# Patient Record
Sex: Female | Born: 1980 | Race: White | Hispanic: No | Marital: Single | Smoking: Current every day smoker
Health system: Southern US, Community
[De-identification: ages and names within clinical notes are randomized; demographics above are authoritative.]

## PROBLEM LIST (undated history)

## (undated) ENCOUNTER — Inpatient Hospital Stay (HOSPITAL_COMMUNITY): Payer: Self-pay

## (undated) DIAGNOSIS — R51 Headache: Secondary | ICD-10-CM

## (undated) DIAGNOSIS — K219 Gastro-esophageal reflux disease without esophagitis: Secondary | ICD-10-CM

## (undated) DIAGNOSIS — F419 Anxiety disorder, unspecified: Secondary | ICD-10-CM

## (undated) DIAGNOSIS — K759 Inflammatory liver disease, unspecified: Secondary | ICD-10-CM

## (undated) DIAGNOSIS — B009 Herpesviral infection, unspecified: Secondary | ICD-10-CM

## (undated) HISTORY — PX: WISDOM TOOTH EXTRACTION: SHX21

## (undated) HISTORY — PX: TONSILLECTOMY AND ADENOIDECTOMY: SHX28

---

## 2013-01-31 ENCOUNTER — Inpatient Hospital Stay (HOSPITAL_COMMUNITY)
Admission: AD | Admit: 2013-01-31 | Discharge: 2013-01-31 | Disposition: A | Discharge status: Home / Self Care | Payer: Medicaid Other | Source: Ambulatory Visit | Attending: Obstetrics & Gynecology | Admitting: Obstetrics & Gynecology

## 2013-01-31 ENCOUNTER — Inpatient Hospital Stay (HOSPITAL_COMMUNITY): Payer: Medicaid Other

## 2013-01-31 ENCOUNTER — Encounter (HOSPITAL_COMMUNITY): Payer: Self-pay

## 2013-01-31 DIAGNOSIS — J029 Acute pharyngitis, unspecified: Secondary | ICD-10-CM | POA: Insufficient documentation

## 2013-01-31 DIAGNOSIS — R51 Headache: Secondary | ICD-10-CM | POA: Insufficient documentation

## 2013-01-31 DIAGNOSIS — K5900 Constipation, unspecified: Secondary | ICD-10-CM | POA: Insufficient documentation

## 2013-01-31 DIAGNOSIS — G43109 Migraine with aura, not intractable, without status migrainosus: Secondary | ICD-10-CM

## 2013-01-31 DIAGNOSIS — O99891 Other specified diseases and conditions complicating pregnancy: Secondary | ICD-10-CM | POA: Insufficient documentation

## 2013-01-31 DIAGNOSIS — R109 Unspecified abdominal pain: Secondary | ICD-10-CM | POA: Insufficient documentation

## 2013-01-31 HISTORY — DX: Anxiety disorder, unspecified: F41.9

## 2013-01-31 LAB — URINE MICROSCOPIC-ADD ON

## 2013-01-31 LAB — URINALYSIS, ROUTINE W REFLEX MICROSCOPIC
Leukocytes, UA: NEGATIVE
Nitrite: NEGATIVE
Specific Gravity, Urine: 1.025 (ref 1.005–1.030)
Urobilinogen, UA: 0.2 mg/dL (ref 0.0–1.0)
pH: 6 (ref 5.0–8.0)

## 2013-01-31 LAB — POCT PREGNANCY, URINE: Preg Test, Ur: POSITIVE — AB

## 2013-01-31 MED ORDER — IBUPROFEN 600 MG PO TABS: 600.0000 mg | ORAL_TABLET | Freq: Four times a day (QID) | ORAL | Status: DC | PRN

## 2013-01-31 MED ORDER — BUTALBITAL-APAP-CAFFEINE 50-325-40 MG PO TABS: 1.0000 | ORAL_TABLET | Freq: Four times a day (QID) | ORAL | Status: DC | PRN

## 2013-01-31 MED ORDER — BUTALBITAL-APAP-CAFFEINE 50-325-40 MG PO TABS
2.0000 | ORAL_TABLET | Freq: Once | ORAL | Status: AC
  Administered 2013-01-31: 2 via ORAL
  Filled 2013-01-31: qty 2

## 2013-01-31 MED ORDER — DIPHENHYDRAMINE HCL 50 MG/ML IJ SOLN
25.0000 mg | Freq: Once | INTRAMUSCULAR | Status: AC
  Administered 2013-01-31: 25 mg via INTRAVENOUS
  Filled 2013-01-31: qty 1

## 2013-01-31 MED ORDER — LACTATED RINGERS IV BOLUS (SEPSIS)
1000.0000 mL | Freq: Once | INTRAVENOUS | Status: AC
  Administered 2013-01-31: 1000 mL via INTRAVENOUS

## 2013-01-31 MED ORDER — DEXAMETHASONE SODIUM PHOSPHATE 10 MG/ML IJ SOLN
10.0000 mg | Freq: Once | INTRAMUSCULAR | Status: AC
  Administered 2013-01-31: 10 mg via INTRAVENOUS
  Filled 2013-01-31: qty 1

## 2013-01-31 MED ORDER — METOCLOPRAMIDE HCL 5 MG/ML IJ SOLN
10.0000 mg | Freq: Once | INTRAMUSCULAR | Status: AC
  Administered 2013-01-31: 10 mg via INTRAVENOUS
  Filled 2013-01-31: qty 2

## 2013-01-31 NOTE — Progress Notes (Signed)
Patient not in lobby

## 2013-01-31 NOTE — MAU Note (Signed)
Pt complains of a sore throat that began 01/27/13. Pt complains of a headache that began 6/714 but now describes it as a 10/10 migraine. She states she is seeing spots and experiencing lightheadedness and dizziness when standing up. Pt also states experiencing constipation but says she had a bowel movement 01/30/13. Pt states she is experiencing intermittent abdominal cramping today that radiates to her back rating it a 7/10. Pt states she currently smokes about 6 cigarettes a day and wants to quit smoking. Pt denies vaginal bleeding/vaginal discharge.

## 2013-01-31 NOTE — MAU Note (Signed)
Patient states she has had a sore throat with slight cough for 3-4 days. Feels like sinus problems. Has been constipated with a BM yesterday but was hard. States she is having mid to upper abdominal pain. Denies vomiting, bleeding or discharge.

## 2013-01-31 NOTE — MAU Provider Note (Signed)
History     CSN: [id number]  Arrival date and time: 01/31/13 1631    First Provider Initiated Contact with Patient 01/31/13 1933      Chief Complaint  Patient presents with  . Sore Throat  . Headache  . Constipation  . Abdominal Cramping   HPI 32 y.o. [id number] at [redacted]w[redacted]d with headache since yesterday, getting worse, photophobia, seeing spots. No nausea. Sore throat x 1 week.   Past Medical History  Diagnosis Date  . Anxiety     Past Surgical History  Procedure Laterality Date  . Cesarean section      Family History  Problem Relation Age of Onset  . Epilepsy Mother   . Cancer Maternal Grandmother     History  Substance Use Topics  . Smoking status: Current Every Day Smoker -- 0.25 packs/day    Types: Cigarettes  . Smokeless tobacco: Not on file  . Alcohol Use: No    Allergies: Not on File  Prescriptions prior to admission  Medication Sig Dispense Refill  . OVER THE COUNTER MEDICATION Take 2 capsules by mouth once.      . Prenatal Vit-Fe Fumarate-FA (PRENATAL MULTIVITAMIN) TABS Take 1 tablet by mouth daily at 12 noon.        Review of Systems  Constitutional: Negative.  Negative for fever.  HENT: Positive for sore throat.   Eyes: Positive for photophobia.       + scotoma   Respiratory: Negative.   Cardiovascular: Negative.   Gastrointestinal: Positive for constipation. Negative for nausea, vomiting, abdominal pain and diarrhea.  Genitourinary: Negative for dysuria, urgency, frequency, hematuria and flank pain.       Negative for vaginal bleeding, vaginal discharge, dyspareunia  Musculoskeletal: Negative.   Neurological: Positive for headaches.  Psychiatric/Behavioral: Negative.    Physical Exam   Blood pressure 106/69, pulse 105, temperature 98.1 F (36.7 C), temperature source Oral, height 5\' 5"  (1.651 m), weight 216 lb (97.977 kg), last menstrual period 10/25/2012, SpO2 99.00%.  Physical Exam  Nursing note and vitals reviewed. Constitutional: She  is oriented to person, place, and time. She appears well-developed and well-nourished. No distress.  Cardiovascular: Normal rate.   Respiratory: Effort normal.  GI: Soft. There is no tenderness.  Musculoskeletal: Normal range of motion.  Neurological: She is alert and oriented to person, place, and time.  Skin: Skin is warm and dry.  Psychiatric: She has a normal mood and affect.    MAU Course  Procedures Results for orders placed during the hospital encounter of 01/31/13 (from the past 24 hour(s))  URINALYSIS, ROUTINE W REFLEX MICROSCOPIC     Status: Abnormal   Collection Time    01/31/13  5:20 PM      Result Value Range   Color, Urine YELLOW  YELLOW   APPearance CLEAR  CLEAR   Specific Gravity, Urine 1.025  1.005 - 1.030   pH 6.0  5.0 - 8.0   Glucose, UA NEGATIVE  NEGATIVE mg/dL   Hgb urine dipstick MODERATE (*) NEGATIVE   Bilirubin Urine NEGATIVE  NEGATIVE   Ketones, ur NEGATIVE  NEGATIVE mg/dL   Protein, ur NEGATIVE  NEGATIVE mg/dL   Urobilinogen, UA 0.2  0.0 - 1.0 mg/dL   Nitrite NEGATIVE  NEGATIVE   Leukocytes, UA NEGATIVE  NEGATIVE  URINE MICROSCOPIC-ADD ON     Status: Abnormal   Collection Time    01/31/13  5:20 PM      Result Value Range   Squamous Epithelial / LPF RARE  RARE   WBC, UA 0-2  <3 WBC/hpf   RBC / HPF 3-6  <3 RBC/hpf   Bacteria, UA FEW (*) RARE   Urine-Other MUCOUS PRESENT    POCT PREGNANCY, URINE     Status: Abnormal   Collection Time    01/31/13  5:27 PM      Result Value Range   Preg Test, Ur POSITIVE (*) NEGATIVE    Assessment and Plan  LR bolus, Decadron 10 mg, Reglan 10 mg, Benadryl 25 mg IV for headache U/S ordered - unable to doppler FHR  FRAZIER,NATALIE 01/31/2013, 7:31 PM   Care of pt turned over to [place], CNM at 2000. [place] ordered.  US Ob Comp Less 14 Wks  01/31/2013   *RADIOLOGY REPORT*  Clinical Data: Dating, unable to Doppler fetal heart tones  OBSTETRIC <14 WK ULTRASOUND  Technique:  Transabdominal ultrasound was performed  for evaluation of the gestation as well as the maternal uterus and adnexal regions.  Comparison:  None.  Intrauterine gestational sac: Visualized/normal in shape. Yolk sac: Not visualized Embryo: Present Cardiac Activity: Present Heart Rate: 176 bpm  CRL:  53 mm  12 w  0 d        [place] EDC: 08/15/2013  Maternal uterus/Adnexae: No subchronic hemorrhage.  Right ovary is within normal limits, measuring 1.3 x 2.4 x 1.7 cm.  Left ovary is not discretely visualized.  No free fluid.  IMPRESSION: Single live intrauterine gestation with estimated gestational age [redacted] weeks 0 days by crown-rump length.   Original Report Authenticated By: Charline Bills, M.D.   HA slightly better w/ Migraine cocktail. Requesting other meds. Fioricet given. Pharynx w/out erythema or exudate. Strep swab collected.   HA resolved w/ Fioricet.  Assessment: 1. Migraine with aura   2. Other current maternal conditions classifiable elsewhere, antepartum   3. Unable to hear fetal heart tones as reason for ultrasound scan   4. Acute viral pharyngitis    Plan: D/C home in stable condition. Strep A pending. Reviewed HA red flags.  Follow-up Information   Please follow up. (Start prenatal care)       Follow up with THE [facility] [place] [facility]. (As needed if symptoms worsen)    Contact information:   [address] [id number] [place] [address] [phone]       Medication List    TAKE these medications       butalbital-acetaminophen-caffeine 50-325-40 MG per tablet  Commonly known as:  FIORICET  Take 1-2 tablets by mouth every 6 (six) hours as needed for headache.     ibuprofen 600 MG tablet  Commonly known as:  ADVIL,MOTRIN  Take 1 tablet (600 mg total) by mouth every 6 (six) hours as needed for pain. Do not take after [redacted] weeks gestation. Do not take for more than 48 hours continuously.     OVER THE COUNTER MEDICATION  Take 2 capsules by mouth once.     prenatal multivitamin  Tabs  Take 1 tablet by mouth daily at 12 noon.       [place], CNM 01/31/2013 11:51 PM

## 2013-02-03 LAB — CULTURE, GROUP A STREP

## 2013-05-24 ENCOUNTER — Ambulatory Visit (HOSPITAL_COMMUNITY): Payer: Medicaid Other

## 2013-05-25 ENCOUNTER — Encounter (HOSPITAL_COMMUNITY): Payer: Self-pay | Admitting: Obstetrics and Gynecology

## 2013-05-31 ENCOUNTER — Ambulatory Visit (HOSPITAL_COMMUNITY): Payer: Medicaid Other | Attending: Obstetrics and Gynecology

## 2013-06-14 ENCOUNTER — Ambulatory Visit (HOSPITAL_COMMUNITY): Payer: Medicaid Other | Attending: Obstetrics & Gynecology

## 2013-07-08 ENCOUNTER — Ambulatory Visit (HOSPITAL_COMMUNITY): Admission: RE | Admit: 2013-07-08 | Payer: Medicaid Other | Source: Ambulatory Visit

## 2013-07-11 ENCOUNTER — Other Ambulatory Visit: Payer: Self-pay | Admitting: Obstetrics and Gynecology

## 2013-07-20 ENCOUNTER — Ambulatory Visit (HOSPITAL_COMMUNITY)
Admission: RE | Admit: 2013-07-20 | Discharge: 2013-07-20 | Disposition: A | Discharge status: Home / Self Care | Payer: Medicaid Other | Source: Ambulatory Visit | Attending: Obstetrics and Gynecology | Admitting: Obstetrics and Gynecology

## 2013-07-20 ENCOUNTER — Encounter (HOSPITAL_COMMUNITY): Payer: Self-pay

## 2013-07-29 ENCOUNTER — Encounter (HOSPITAL_COMMUNITY): Payer: Self-pay | Admitting: Pharmacist

## 2013-08-05 NOTE — Consult Note (Signed)
MFM Note  Kimberly Fritz is a 32 year old G4P3 Caucasian female at ~ 3 weeks who presents for consultation because she is hepatitis C positive. She was diagnosed in 2012 and it most likely was transmitted from her partner who is also hepatitis C positive. She also has a history of IV drug abuse but has been drug free for 13 months.  In January of 2013, she was seen by gastroenterology at [facility] during her most recent pregnancy. They obtained several labs: HBV surface antibody was positive; HCV genotyping - 1a; and HCV RNA load which was 41,000. Their plans were to perform a liver ultrasound and start treatment after delivery if she was not taking illicit drugs. However, Kimberly Fritz lost her medical insurance and therefore did not follow-up with them.    Kimberly Fritz and I had a discussion about vertical transmission of hepatitis C to her fetus. Since she is HIV negative, the risk of transmission is ~ 5 %. Also, there are mixed data about whether pregnancy has a deleterious on her hepatitis C course.  Breast feeding is OK as long as the nipples are not cracked or bloody. (However, I think her plans at this time are to adopt out using the [employer]).  Her OB history is significant for two vaginal deliveries at 36 and 35 weeks. Then in 2013 she had an urgent C/S at 35 weeks for non reassuring heart tracings. She desires a repeat C/S with a tubal ligation.   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  (Face-to-face consultation with patient: 30 min)

## 2013-08-08 ENCOUNTER — Encounter (HOSPITAL_COMMUNITY)
Admission: RE | Admit: 2013-08-08 | Discharge: 2013-08-08 | Disposition: A | Discharge status: Home / Self Care | Payer: Medicaid Other | Source: Ambulatory Visit | Attending: Obstetrics and Gynecology | Admitting: Obstetrics and Gynecology

## 2013-08-08 ENCOUNTER — Encounter (HOSPITAL_COMMUNITY): Payer: Self-pay

## 2013-08-08 HISTORY — DX: Gastro-esophageal reflux disease without esophagitis: K21.9

## 2013-08-08 HISTORY — DX: Herpesviral infection, unspecified: B00.9

## 2013-08-08 HISTORY — DX: Headache: R51

## 2013-08-08 HISTORY — DX: Inflammatory liver disease, unspecified: K75.9

## 2013-08-08 LAB — CBC
Hemoglobin: 11.8 g/dL — ABNORMAL LOW (ref 12.0–15.0)
MCH: 26.7 pg (ref 26.0–34.0)
MCHC: 33.5 g/dL (ref 30.0–36.0)
Platelets: 246 10*3/uL (ref 150–400)
RBC: 4.42 MIL/uL (ref 3.87–5.11)

## 2013-08-08 LAB — RPR: RPR Ser Ql: NONREACTIVE

## 2013-08-08 NOTE — Patient Instructions (Addendum)
   Your procedure is scheduled on:   Tuesday, Dec 16  Enter through the [employer] [facility] at: 8 am Pick up the phone at the desk and dial [phone] and inform us of your arrival.  Please call this number if you have any problems the morning of surgery: [phone]  Remember: Do not eat or drink after midnight: Monday Take these medicines the morning of surgery with a SIP OF WATER:  Zantac, valtrex  Do not wear jewelry, make-up, or FINGER nail polish No metal in your hair or on your body. Do not wear lotions, powders, perfumes. You may wear deodorant.  Please use your CHG wash as directed prior to surgery.  Do not shave anywhere for at least 12 hours prior to first CHG shower.  Do not bring valuables to the hospital. Contacts, dentures or bridgework may not be worn into surgery.  Leave suitcase in the car. After Surgery it may be brought to your room. For patients being admitted to the hospital, checkout time is 11:00am the day of discharge.  Home with mother Darel Hong cell [phone].

## 2013-08-08 NOTE — H&P (Signed)
Admission History and Physical Exam for an Obstetrics Patient  Kimberly Fritz is a 32 y.o. female, [phone], at 84w gestation, who presents for a repeat cesarean section and tubal sterilization procedure. She has been followed at the [facility].  Her pregnancy has been complicated by :  Chronic hepatitis C Herpes simplex virus. Previous cesarean section. Desires sterilization. Past history of substance abuse. Late prenatal care. Rubella nonimmune. History of preterm delivery. Anemia. Obesity. Migraine headaches  See history below.  OB History   Grav Para Term Preterm Abortions TAB SAB Ect Mult Living   4 3 1 2      3       Past Medical History  Diagnosis Date  . SVD (spontaneous vaginal delivery) 2001,2009    x 2  . Headache(784.0)   . GERD (gastroesophageal reflux disease)   . Herpes   . Anxiety     history - no meds x 1 yr  . Hepatitis     Hep C - dx 2 yrs ago - no meds - levels checked monthly    No prescriptions prior to admission    Past Surgical History  Procedure Laterality Date  . Cesarean section  2012    x 1  . Tonsillectomy and adenoidectomy    . Wisdom tooth extraction      No Known Allergies  Family History: family history includes Cancer in her maternal grandmother; Epilepsy in her mother.  Social History:  reports that she has been smoking Cigarettes.  She has a 3.75 pack-year smoking history. She has never used smokeless tobacco. She reports that she does not drink alcohol or use illicit drugs.  Review of systems: Normal pregnancy complaints.  Admission Physical Exam:    There is no weight on file to calculate BMI.  Last menstrual period 11/09/2012.  HEENT:                 Within normal limits Chest:                   Clear Heart:                    Regular rate and rhythm Abdomen:             Gravid and nontender Extremities:          Grossly  normal Neurologic exam: Grossly normal Pelvic exam:         Cervix: closing the mass checked  Prenatal labs: ABO, Rh:             --/--/O POS (12/15 1211) HBsAg:                   nonreactive HIV:                         nonreactive GBS:                      negative Antibody:              NEG (12/15 1211) Rubella:                 nonimmune RPR:                    NON REACTIVE (12/15 1210)   Results for orders placed during the hospital encounter of 08/08/13 (from the past 24 hour(s))  CBC     Status: Abnormal   Collection Time    08/08/13 12:10 PM      Result Value Range   WBC 12.1 (*) 4.0 - 10.5 K/uL   RBC 4.42  3.87 - 5.11 MIL/uL   Hemoglobin 11.8 (*) 12.0 - 15.0 g/dL   HCT 28.4 (*) 13.2 - 44.0 %   MCV 79.6  78.0 - 100.0 fL   MCH 26.7  26.0 - 34.0 pg   MCHC 33.5  30.0 - 36.0 g/dL   RDW 10.2  72.5 - 36.6 %   Platelets 246  150 - 400 K/uL  RPR     Status: None   Collection Time    08/08/13 12:10 PM      Result Value Range   RPR NON REACTIVE  NON REACTIVE  ABO/RH     Status: None   Collection Time    08/08/13 12:10 PM      Result Value Range   ABO/RH(D) O POS    TYPE AND SCREEN     Status: None   Collection Time    08/08/13 12:11 PM      Result Value Range   ABO/RH(D) O POS     Antibody Screen NEG     Sample Expiration 08/11/2013          Prenatal Transfer Tool  Maternal Diabetes: No Genetic Screening: Normal Maternal Ultrasounds/Referrals: Normal Fetal Ultrasounds or other Referrals:  None Maternal Substance Abuse:  Not currently.  Negative drug screen on August 05, 2013 Significant Maternal Medications:  Valtrex, Urised for headaches Significant Maternal Lab Results:  Beta strep negative, quad screen negative, three-hour GTT normal Other Comments:  the patient has received her flu shot and her TDAP Shot  Assessment:  39 w gestation  Chronic hepatitis C Herpes simplex virus. Previous cesarean section. Desires sterilization. Past history of  substance abuse. Late prenatal care. Rubella nonimmune. History of preterm delivery. Anemia. Obesity. Migraine headaches  Plan:  Repeat cesarean section and tubal sterilization procedure Rubella vaccination postpartum   Janine Limbo 08/08/2013, 8:36 PM

## 2013-08-09 ENCOUNTER — Inpatient Hospital Stay (HOSPITAL_COMMUNITY)
Admission: RE | Admit: 2013-08-09 | Discharge: 2013-08-12 | DRG: 765 | Disposition: A | Discharge status: Home / Self Care | Payer: Medicaid Other | Source: Ambulatory Visit | Attending: Obstetrics and Gynecology | Admitting: Obstetrics and Gynecology

## 2013-08-09 ENCOUNTER — Encounter (HOSPITAL_COMMUNITY)
Admission: RE | Disposition: A | Discharge status: Home / Self Care | Payer: Self-pay | Source: Ambulatory Visit | Attending: Obstetrics and Gynecology

## 2013-08-09 ENCOUNTER — Inpatient Hospital Stay (HOSPITAL_COMMUNITY): Payer: Medicaid Other | Admitting: Anesthesiology

## 2013-08-09 ENCOUNTER — Encounter (HOSPITAL_COMMUNITY): Payer: Self-pay | Admitting: *Deleted

## 2013-08-09 ENCOUNTER — Encounter (HOSPITAL_COMMUNITY): Payer: Medicaid Other | Admitting: Anesthesiology

## 2013-08-09 DIAGNOSIS — O0930 Supervision of pregnancy with insufficient antenatal care, unspecified trimester: Secondary | ICD-10-CM

## 2013-08-09 DIAGNOSIS — O98519 Other viral diseases complicating pregnancy, unspecified trimester: Secondary | ICD-10-CM | POA: Diagnosis present

## 2013-08-09 DIAGNOSIS — Z302 Encounter for sterilization: Secondary | ICD-10-CM

## 2013-08-09 DIAGNOSIS — E669 Obesity, unspecified: Secondary | ICD-10-CM | POA: Diagnosis present

## 2013-08-09 DIAGNOSIS — A6000 Herpesviral infection of urogenital system, unspecified: Secondary | ICD-10-CM | POA: Diagnosis present

## 2013-08-09 DIAGNOSIS — O26619 Liver and biliary tract disorders in pregnancy, unspecified trimester: Secondary | ICD-10-CM | POA: Diagnosis present

## 2013-08-09 DIAGNOSIS — O34219 Maternal care for unspecified type scar from previous cesarean delivery: Principal | ICD-10-CM | POA: Diagnosis present

## 2013-08-09 DIAGNOSIS — O9902 Anemia complicating childbirth: Secondary | ICD-10-CM | POA: Diagnosis present

## 2013-08-09 DIAGNOSIS — B182 Chronic viral hepatitis C: Secondary | ICD-10-CM | POA: Diagnosis present

## 2013-08-09 DIAGNOSIS — O99334 Smoking (tobacco) complicating childbirth: Secondary | ICD-10-CM | POA: Diagnosis present

## 2013-08-09 SURGERY — Surgical Case
Anesthesia: Spinal | Site: Abdomen | Laterality: Bilateral

## 2013-08-09 MED ORDER — FENTANYL CITRATE 0.05 MG/ML IJ SOLN
INTRAMUSCULAR | Status: AC
  Filled 2013-08-09: qty 2

## 2013-08-09 MED ORDER — OXYTOCIN 10 UNIT/ML IJ SOLN
INTRAMUSCULAR | Status: AC
  Filled 2013-08-09: qty 4

## 2013-08-09 MED ORDER — ONDANSETRON HCL 4 MG/2ML IJ SOLN: 4.0000 mg | INTRAMUSCULAR | Status: DC | PRN

## 2013-08-09 MED ORDER — SCOPOLAMINE 1 MG/3DAYS TD PT72
MEDICATED_PATCH | TRANSDERMAL | Status: AC
  Administered 2013-08-09: 1.5 mg via TRANSDERMAL
  Filled 2013-08-09: qty 1

## 2013-08-09 MED ORDER — MEPERIDINE HCL 25 MG/ML IJ SOLN: 6.2500 mg | INTRAMUSCULAR | Status: DC | PRN

## 2013-08-09 MED ORDER — ONDANSETRON HCL 4 MG/2ML IJ SOLN
INTRAMUSCULAR | Status: AC
  Filled 2013-08-09: qty 2

## 2013-08-09 MED ORDER — HYDROMORPHONE HCL PF 1 MG/ML IJ SOLN
0.2500 mg | INTRAMUSCULAR | Status: DC | PRN
  Administered 2013-08-09: 0.5 mg via INTRAVENOUS

## 2013-08-09 MED ORDER — HYDROMORPHONE HCL PF 1 MG/ML IJ SOLN
INTRAMUSCULAR | Status: AC
  Administered 2013-08-09: 0.5 mg via INTRAVENOUS
  Filled 2013-08-09: qty 1

## 2013-08-09 MED ORDER — DIPHENHYDRAMINE HCL 25 MG PO CAPS: 25.0000 mg | ORAL_CAPSULE | Freq: Four times a day (QID) | ORAL | Status: DC | PRN

## 2013-08-09 MED ORDER — CEFAZOLIN SODIUM-DEXTROSE 2-3 GM-% IV SOLR
2.0000 g | INTRAVENOUS | Status: AC
  Administered 2013-08-09: 2 mg via INTRAVENOUS

## 2013-08-09 MED ORDER — NALBUPHINE SYRINGE 5 MG/0.5 ML
INJECTION | INTRAMUSCULAR | Status: AC
  Administered 2013-08-09: 10 mg via SUBCUTANEOUS
  Filled 2013-08-09: qty 1

## 2013-08-09 MED ORDER — IBUPROFEN 600 MG PO TABS
600.0000 mg | ORAL_TABLET | Freq: Four times a day (QID) | ORAL | Status: DC
  Administered 2013-08-09 – 2013-08-12 (×10): 600 mg via ORAL
  Filled 2013-08-09 (×10): qty 1

## 2013-08-09 MED ORDER — DEXTROSE 5 % IV SOLN
1.0000 ug/kg/h | INTRAVENOUS | Status: DC | PRN
  Filled 2013-08-09: qty 2

## 2013-08-09 MED ORDER — SCOPOLAMINE 1 MG/3DAYS TD PT72
1.0000 | MEDICATED_PATCH | Freq: Once | TRANSDERMAL | Status: DC
  Filled 2013-08-09: qty 1

## 2013-08-09 MED ORDER — WITCH HAZEL-GLYCERIN EX PADS: 1.0000 "application " | MEDICATED_PAD | CUTANEOUS | Status: DC | PRN

## 2013-08-09 MED ORDER — SIMETHICONE 80 MG PO CHEW
80.0000 mg | CHEWABLE_TABLET | Freq: Three times a day (TID) | ORAL | Status: DC
  Administered 2013-08-10 – 2013-08-12 (×6): 80 mg via ORAL
  Filled 2013-08-09 (×7): qty 1

## 2013-08-09 MED ORDER — SENNOSIDES-DOCUSATE SODIUM 8.6-50 MG PO TABS
2.0000 | ORAL_TABLET | ORAL | Status: DC
  Administered 2013-08-09 – 2013-08-11 (×3): 2 via ORAL
  Filled 2013-08-09 (×3): qty 2

## 2013-08-09 MED ORDER — ONDANSETRON HCL 4 MG PO TABS: 4.0000 mg | ORAL_TABLET | ORAL | Status: DC | PRN

## 2013-08-09 MED ORDER — KETOROLAC TROMETHAMINE 30 MG/ML IJ SOLN
30.0000 mg | Freq: Once | INTRAMUSCULAR | Status: AC
  Administered 2013-08-09: 30 mg via INTRAVENOUS
  Filled 2013-08-09: qty 1

## 2013-08-09 MED ORDER — FENTANYL CITRATE 0.05 MG/ML IJ SOLN
INTRAMUSCULAR | Status: DC | PRN
  Administered 2013-08-09: 25 ug via INTRAVENOUS

## 2013-08-09 MED ORDER — LANOLIN HYDROUS EX OINT: 1.0000 "application " | TOPICAL_OINTMENT | CUTANEOUS | Status: DC | PRN

## 2013-08-09 MED ORDER — PHENYLEPHRINE HCL 10 MG/ML IJ SOLN
INTRAMUSCULAR | Status: AC
  Filled 2013-08-09: qty 1

## 2013-08-09 MED ORDER — DIPHENHYDRAMINE HCL 25 MG PO CAPS: 25.0000 mg | ORAL_CAPSULE | ORAL | Status: DC | PRN

## 2013-08-09 MED ORDER — ZOLPIDEM TARTRATE 5 MG PO TABS: 5.0000 mg | ORAL_TABLET | Freq: Every evening | ORAL | Status: DC | PRN

## 2013-08-09 MED ORDER — NALBUPHINE HCL 10 MG/ML IJ SOLN
5.0000 mg | INTRAMUSCULAR | Status: DC | PRN
  Administered 2013-08-09: 10 mg via SUBCUTANEOUS
  Filled 2013-08-09: qty 1

## 2013-08-09 MED ORDER — OXYTOCIN 10 UNIT/ML IJ SOLN
40.0000 [IU] | INTRAVENOUS | Status: DC | PRN
  Administered 2013-08-09: 40 [IU] via INTRAVENOUS

## 2013-08-09 MED ORDER — DIPHENHYDRAMINE HCL 50 MG/ML IJ SOLN: 12.5000 mg | INTRAMUSCULAR | Status: DC | PRN

## 2013-08-09 MED ORDER — SIMETHICONE 80 MG PO CHEW: 80.0000 mg | CHEWABLE_TABLET | ORAL | Status: DC | PRN

## 2013-08-09 MED ORDER — SCOPOLAMINE 1 MG/3DAYS TD PT72
1.0000 | MEDICATED_PATCH | Freq: Once | TRANSDERMAL | Status: AC
  Administered 2013-08-09: 1.5 mg via TRANSDERMAL

## 2013-08-09 MED ORDER — DIPHENHYDRAMINE HCL 50 MG/ML IJ SOLN: 25.0000 mg | INTRAMUSCULAR | Status: DC | PRN

## 2013-08-09 MED ORDER — MEASLES, MUMPS & RUBELLA VAC ~~LOC~~ INJ
0.5000 mL | INJECTION | Freq: Once | SUBCUTANEOUS | Status: AC
  Administered 2013-08-10: 0.5 mL via SUBCUTANEOUS
  Filled 2013-08-09 (×2): qty 0.5

## 2013-08-09 MED ORDER — PRENATAL MULTIVITAMIN CH
1.0000 | ORAL_TABLET | Freq: Every day | ORAL | Status: DC
  Administered 2013-08-10 – 2013-08-11 (×2): 1 via ORAL
  Filled 2013-08-09 (×2): qty 1

## 2013-08-09 MED ORDER — NALBUPHINE HCL 10 MG/ML IJ SOLN
5.0000 mg | INTRAMUSCULAR | Status: DC | PRN
  Filled 2013-08-09: qty 1

## 2013-08-09 MED ORDER — OXYCODONE-ACETAMINOPHEN 5-325 MG PO TABS
1.0000 | ORAL_TABLET | ORAL | Status: DC | PRN
  Administered 2013-08-09 – 2013-08-10 (×2): 1 via ORAL
  Administered 2013-08-10 (×2): 2 via ORAL
  Administered 2013-08-10: 1 via ORAL
  Administered 2013-08-11 – 2013-08-12 (×4): 2 via ORAL
  Filled 2013-08-09: qty 2
  Filled 2013-08-09: qty 1
  Filled 2013-08-09 (×3): qty 2
  Filled 2013-08-09: qty 1
  Filled 2013-08-09 (×2): qty 2
  Filled 2013-08-09: qty 1
  Filled 2013-08-09: qty 2

## 2013-08-09 MED ORDER — ONDANSETRON HCL 4 MG/2ML IJ SOLN: 4.0000 mg | Freq: Three times a day (TID) | INTRAMUSCULAR | Status: DC | PRN

## 2013-08-09 MED ORDER — TETANUS-DIPHTH-ACELL PERTUSSIS 5-2.5-18.5 LF-MCG/0.5 IM SUSP: 0.5000 mL | Freq: Once | INTRAMUSCULAR | Status: DC

## 2013-08-09 MED ORDER — MORPHINE SULFATE 0.5 MG/ML IJ SOLN
INTRAMUSCULAR | Status: AC
  Filled 2013-08-09: qty 10

## 2013-08-09 MED ORDER — MENTHOL 3 MG MT LOZG: 1.0000 | LOZENGE | OROMUCOSAL | Status: DC | PRN

## 2013-08-09 MED ORDER — MEDROXYPROGESTERONE ACETATE 150 MG/ML IM SUSP: 150.0000 mg | INTRAMUSCULAR | Status: DC | PRN

## 2013-08-09 MED ORDER — NALOXONE HCL 0.4 MG/ML IJ SOLN: 0.4000 mg | INTRAMUSCULAR | Status: DC | PRN

## 2013-08-09 MED ORDER — BUPIVACAINE-EPINEPHRINE (PF) 0.5% -1:200000 IJ SOLN
INTRAMUSCULAR | Status: AC
  Filled 2013-08-09: qty 10

## 2013-08-09 MED ORDER — PHENYLEPHRINE HCL 10 MG/ML IJ SOLN
20.0000 mg | INTRAVENOUS | Status: DC | PRN
  Administered 2013-08-09: 60 ug/min via INTRAVENOUS

## 2013-08-09 MED ORDER — LACTATED RINGERS IV SOLN
Freq: Once | INTRAVENOUS | Status: AC
  Administered 2013-08-09: 08:00:00 via INTRAVENOUS

## 2013-08-09 MED ORDER — ONDANSETRON HCL 4 MG/2ML IJ SOLN
INTRAMUSCULAR | Status: DC | PRN
  Administered 2013-08-09: 4 mg via INTRAVENOUS

## 2013-08-09 MED ORDER — DIBUCAINE 1 % RE OINT: 1.0000 "application " | TOPICAL_OINTMENT | RECTAL | Status: DC | PRN

## 2013-08-09 MED ORDER — METOCLOPRAMIDE HCL 5 MG/ML IJ SOLN: 10.0000 mg | Freq: Three times a day (TID) | INTRAMUSCULAR | Status: DC | PRN

## 2013-08-09 MED ORDER — MORPHINE SULFATE (PF) 0.5 MG/ML IJ SOLN
INTRAMUSCULAR | Status: DC | PRN
  Administered 2013-08-09: .15 mg via EPIDURAL

## 2013-08-09 MED ORDER — KETOROLAC TROMETHAMINE 30 MG/ML IJ SOLN
INTRAMUSCULAR | Status: AC
  Administered 2013-08-09: 30 mg
  Filled 2013-08-09: qty 1

## 2013-08-09 MED ORDER — SODIUM CHLORIDE 0.9 % IJ SOLN: 3.0000 mL | INTRAMUSCULAR | Status: DC | PRN

## 2013-08-09 MED ORDER — BUPIVACAINE-EPINEPHRINE 0.5% -1:200000 IJ SOLN
INTRAMUSCULAR | Status: DC | PRN
  Administered 2013-08-09: 10 mL

## 2013-08-09 MED ORDER — SIMETHICONE 80 MG PO CHEW
80.0000 mg | CHEWABLE_TABLET | ORAL | Status: DC
  Administered 2013-08-09 – 2013-08-11 (×3): 80 mg via ORAL
  Filled 2013-08-09 (×2): qty 1

## 2013-08-09 MED ORDER — OXYTOCIN 40 UNITS IN LACTATED RINGERS INFUSION - SIMPLE MED: 62.5000 mL/h | INTRAVENOUS | Status: AC

## 2013-08-09 MED ORDER — LACTATED RINGERS IV SOLN
INTRAVENOUS | Status: DC
  Administered 2013-08-09 (×3): via INTRAVENOUS

## 2013-08-09 MED ORDER — LACTATED RINGERS IV SOLN
INTRAVENOUS | Status: DC
  Administered 2013-08-10: 02:00:00 via INTRAVENOUS

## 2013-08-09 SURGICAL SUPPLY — 39 items
CLAMP CORD UMBIL (MISCELLANEOUS) ×2 IMPLANT
CLOTH BEACON ORANGE TIMEOUT ST (SAFETY) ×2 IMPLANT
CONTAINER PREFILL 10% NBF 15ML (MISCELLANEOUS) ×4 IMPLANT
DRAIN JACKSON PRT FLT 7MM (DRAIN) IMPLANT
DRAPE LG THREE QUARTER DISP (DRAPES) ×2 IMPLANT
DRSG OPSITE POSTOP 4X10 (GAUZE/BANDAGES/DRESSINGS) ×2 IMPLANT
DURAPREP 26ML APPLICATOR (WOUND CARE) ×2 IMPLANT
ELECT REM PT RETURN 9FT ADLT (ELECTROSURGICAL) ×2
ELECTRODE REM PT RTRN 9FT ADLT (ELECTROSURGICAL) ×1 IMPLANT
EVACUATOR SILICONE 100CC (DRAIN) IMPLANT
EXTRACTOR VACUUM M CUP 4 TUBE (SUCTIONS) IMPLANT
GLOVE BIOGEL PI IND STRL 8.5 (GLOVE) ×1 IMPLANT
GLOVE BIOGEL PI INDICATOR 8.5 (GLOVE) ×1
GLOVE ECLIPSE 8.0 STRL XLNG CF (GLOVE) ×4 IMPLANT
GOWN PREVENTION PLUS XXLARGE (GOWN DISPOSABLE) ×2 IMPLANT
GOWN STRL REIN XL XLG (GOWN DISPOSABLE) ×4 IMPLANT
KIT ABG SYR 3ML LUER SLIP (SYRINGE) IMPLANT
NEEDLE HYPO 25X1 1.5 SAFETY (NEEDLE) ×2 IMPLANT
NEEDLE HYPO 25X5/8 SAFETYGLIDE (NEEDLE) IMPLANT
PACK C SECTION WH (CUSTOM PROCEDURE TRAY) ×2 IMPLANT
PAD OB MATERNITY 4.3X12.25 (PERSONAL CARE ITEMS) ×2 IMPLANT
RETRACTOR WND ALEXIS 25 LRG (MISCELLANEOUS) ×1 IMPLANT
RINGERS IRRIG 1000ML POUR BTL (IV SOLUTION) ×2 IMPLANT
RTRCTR WOUND ALEXIS 25CM LRG (MISCELLANEOUS) ×2
STAPLER VISISTAT 35W (STAPLE) IMPLANT
SUT MNCRL AB 3-0 PS2 27 (SUTURE) ×2 IMPLANT
SUT PLAIN 0 NONE (SUTURE) IMPLANT
SUT SILK 3 0 FS 1X18 (SUTURE) IMPLANT
SUT VIC AB 0 CT1 27 (SUTURE) ×2
SUT VIC AB 0 CT1 27XBRD ANBCTR (SUTURE) ×2 IMPLANT
SUT VIC AB 2-0 CTX 36 (SUTURE) ×4 IMPLANT
SUT VIC AB 3-0 CT1 27 (SUTURE)
SUT VIC AB 3-0 CT1 TAPERPNT 27 (SUTURE) IMPLANT
SUT VIC AB 3-0 SH 27 (SUTURE)
SUT VIC AB 3-0 SH 27X BRD (SUTURE) IMPLANT
SYR CONTROL 10ML LL (SYRINGE) ×2 IMPLANT
TOWEL OR 17X24 6PK STRL BLUE (TOWEL DISPOSABLE) ×2 IMPLANT
TRAY FOLEY CATH 14FR (SET/KITS/TRAYS/PACK) ×2 IMPLANT
WATER STERILE IRR 1000ML POUR (IV SOLUTION) ×2 IMPLANT

## 2013-08-09 NOTE — Anesthesia Procedure Notes (Signed)

## 2013-08-09 NOTE — Lactation Note (Signed)
This note was copied from the chart of Boy [place]. Lactation Consultation Note  Patient Name: Boy Yariah Selvey Today's Date: 08/09/2013 Reason for consult: Initial assessment;NICU baby   Maternal Data Formula Feeding for Exclusion: Yes Reason for exclusion: Mother's choice to formula feed on admision;Adoption or foster home placement of newborn  Feeding Feeding Type: Formula Nipple Type: Slow - flow Length of feed: 10 min  LATCH Score/Interventions                      Lactation Tools Discussed/Used     Consult Status Consult Status: Complete    Alfred Levins 08/09/2013, 5:23 PM

## 2013-08-09 NOTE — Transfer of Care (Signed)
Immediate Anesthesia Transfer of Care Note  Patient: Kimberly Fritz  Procedure(s) Performed: Procedure(s): REPEAT CESAREAN SECTION WITH BILATERAL TUBAL LIGATION (Bilateral)  Patient Location: PACU  Anesthesia Type:Spinal  Level of Consciousness: awake, alert , oriented and patient cooperative  Airway & Oxygen Therapy: Patient Spontanous Breathing  Post-op Assessment: Report given to PACU RN and Post -op Vital signs reviewed and stable  Post vital signs: Reviewed and stable  Complications: No apparent anesthesia complications

## 2013-08-09 NOTE — H&P (Signed)
The patient was interviewed and examined today.  The previously documented history and physical examination was reviewed. There are no changes. The operative procedure was reviewed. The risks and benefits were outlined again. The specific risks include, but are not limited to, anesthetic complications, bleeding, infections, and possible damage to the surrounding organs. The patient's questions were answered.  We are ready to proceed as outlined. The likelihood of the patient achieving the goals of this procedure is very likely.   BP 129/81  Pulse 105  Temp(Src) 97.2 F (36.2 C) (Oral)  Resp 20  SpO2 100%  LMP 11/09/2012   Leonard Schwartz, M.D.

## 2013-08-09 NOTE — Op Note (Signed)
OPERATIVE NOTE  Patient's Name: Kimberly Fritz  Date of Birth: [birth year]   Medical Records Number: [id number]   Date of Operation: 08/09/2013   Preoperative diagnosis:  [redacted]w[redacted]d weeks gestation  Prior Cesarean Section, Desire for surgical Sterilization  Desires Repeat Cesarean Section  Postoperative diagnosis:  [redacted]w[redacted]d weeks gestation  same  Desires Repeat Cesarean Section  Procedure:  Repeat low transverse cesarean section Bilateral tubal sterilization procedure by way of bilateral salpingectomy  Surgeon:  Leonard Schwartz, M.D.  Assistant:  Operating room staff  Anesthesia:  Spinal  Disposition:  Kimberly Fritz is a 32 y.o. female, [redacted]w[redacted]d, who presents at [redacted]w[redacted]d weeks gestation. The patient has been followed at the [facility] division of [facility] care for women. This pregnancy has been complicated by a prior cesarean section. The patient desires a repeat cesarean section. She understands the indications for her procedure and she accepts the risk of, but not limited to, anesthetic complications, bleeding, infections, and possible damage to the surrounding organs. The patient desires sterilization. Sterilization options were reviewed. The patient elects to have a bilateral salpingectomy because the potential benefit of cancer reduction. She understands that there is a small failure rate associated with tubal sterilization.  Findings:  A  female Jean Rosenthal) was delivered from a occiput anterior position.  The Apgar scores were 8/9. The uterus, fallopian tubes, and ovaries were normal for the gravid state. Two nuchal cords were noted. A true knot was in the cord. Meconium fluid was present.  Procedure:  The patient was taken to the operating room where a spinal anesthetic was given.The perineum was prepped with betadine. A Foley catheter was placed in the bladder.The patient's abdomen was prepped with Duraprep.   The patient was  sterilely draped. A "timeout" was performed which properly identified the patient and the correct operative procedure. The lower abdomen was injected with half percent Marcaine with epinephrine. A low transverse incision was made in the abdomen and carried sharply through the subcutaneous tissue, the fascia, and the anterior peritoneum. An incision was made in the lower uterine segment. The incision was extended in a low transverse fashion. The membranes were ruptured. The fetal head was delivered without difficulty. The mouth and nose were suctioned. The remainder of the infant was then delivered. The cord was clamped and cut. The infant was handed to the awaiting pediatric team. The placenta was removed. The uterine cavity was cleaned of amniotic fluid, clotted blood, and membranes. The uterine incision was closed using a running locking suture of 2-0 Vicryl. An imbricating suture of 2-0 Vicryl was placed. The pelvis was vigorously irrigated. Hemostasis was adequate. The left fallopian tube was identified and followed to its fimbriated end. The mesosalpinx was clamped and cut. A free tie and then a suture ligature were then placed. Hemostasis was noted to be adequate. An identical procedure was carried out on the opposite side. The anterior peritoneum and the abdominal musculature were closed using 2-0 Vicryl. The fascia was closed using a running suture of 0 Vicryl followed by 3 interrupted sutures of 0 Vicryl. The subcutaneous layer was closed using interrupted sutures of 2-0 Vicryl. The skin was reapproximated using a subcuticular suture of 3-0 Monocryl. Sponge, needle, and instrument counts were correct on 2 occasions. The estimated blood loss for the procedure was 700 cc. The patient tolerated her procedure well. She was transported to the recovery room in stable condition. The infant remained in the operating room with the mother for bonding. The placenta  was sent to pathology. Both fallopian tubes were sent  to pathology.  Leonard Schwartz, M.D.

## 2013-08-09 NOTE — Anesthesia Postprocedure Evaluation (Signed)
  Anesthesia Post-op Note  Patient: Kimberly Fritz  Procedure(s) Performed: Procedure(s): REPEAT CESAREAN SECTION WITH BILATERAL TUBAL LIGATION (Bilateral)  Patient Location: PACU  Anesthesia Type:Spinal  Level of Consciousness: awake, alert  and oriented  Airway and Oxygen Therapy: Patient Spontanous Breathing  Post-op Pain: none  Post-op Assessment: Post-op Vital signs reviewed, Patient's Cardiovascular Status Stable, Respiratory Function Stable, Patent Airway, No signs of Nausea or vomiting, Pain level controlled, No headache and No backache  Post-op Vital Signs: Reviewed and stable  Complications: No apparent anesthesia complications

## 2013-08-09 NOTE — Anesthesia Postprocedure Evaluation (Signed)
  Anesthesia Post-op Note  Patient: Kimberly Fritz  Procedure(s) Performed: Procedure(s): REPEAT CESAREAN SECTION WITH BILATERAL TUBAL LIGATION (Bilateral)  Patient Location: PACU and Women's Unit  Anesthesia Type:Spinal  Level of Consciousness: awake, alert  and oriented  Airway and Oxygen Therapy: Patient Spontanous Breathing  Post-op Pain: moderate  Post-op Assessment: PATIENT'S CARDIOVASCULAR STATUS UNSTABLE, Patent Airway, No signs of Nausea or vomiting and Adequate PO intake  Post-op Vital Signs: stable  Complications: No apparent anesthesia complications

## 2013-08-09 NOTE — Progress Notes (Signed)
UR chart review completed.  

## 2013-08-09 NOTE — Anesthesia Preprocedure Evaluation (Signed)
Anesthesia Evaluation  Patient identified by MRN, date of birth, ID band Patient awake    Reviewed: Allergy & Precautions, H&P , Patient's Chart, lab work & pertinent test results  Airway Mallampati: II TM Distance: >3 FB Neck ROM: full    Dental no notable dental hx.    Pulmonary Current Smoker,  breath sounds clear to auscultation  Pulmonary exam normal       Cardiovascular Exercise Tolerance: Good Rhythm:regular Rate:Normal     Neuro/Psych    GI/Hepatic GERD-  ,(+) Hepatitis -, C  Endo/Other  Morbid obesity  Renal/GU      Musculoskeletal   Abdominal   Peds  Hematology   Anesthesia Other Findings   Reproductive/Obstetrics                           Anesthesia Physical Anesthesia Plan  ASA: III  Anesthesia Plan: Spinal   Post-op Pain Management:    Induction:   Airway Management Planned:   Additional Equipment:   Intra-op Plan:   Post-operative Plan:   Informed Consent: I have reviewed the patients History and Physical, chart, labs and discussed the procedure including the risks, benefits and alternatives for the proposed anesthesia with the patient or authorized representative who has indicated his/her understanding and acceptance.   Dental Advisory Given  Plan Discussed with: CRNA  Anesthesia Plan Comments: (Lab work confirmed with CRNA in room. Platelets okay. Discussed spinal anesthetic, and patient consents to the procedure:  included risk of possible headache,backache, failed block, allergic reaction, and nerve injury. This patient was asked if she had any questions or concerns before the procedure started. )        Anesthesia Quick Evaluation

## 2013-08-10 ENCOUNTER — Encounter (HOSPITAL_COMMUNITY): Payer: Self-pay | Admitting: Obstetrics and Gynecology

## 2013-08-10 LAB — CBC
HCT: 28.8 % — ABNORMAL LOW (ref 36.0–46.0)
Hemoglobin: 9.6 g/dL — ABNORMAL LOW (ref 12.0–15.0)
MCH: 27 pg (ref 26.0–34.0)
MCHC: 33.3 g/dL (ref 30.0–36.0)
MCV: 80.9 fL (ref 78.0–100.0)
Platelets: 182 10*3/uL (ref 150–400)

## 2013-08-11 NOTE — Progress Notes (Signed)
Subjective: Postpartum Day 1: Cesarean Delivery due to repeat, with BTL Patient up ad lib, reports no syncope or dizziness.  Baby in NICU due to hypoglycemia, but doing well. Patient has visited him several times--plans relinquishing to [facility] for adoption. Feeding:  Bottle Contraceptive plan:  BTL  Objective: Vital signs in last 24 hours: Temp:  [97.5 F (36.4 C)-97.8 F (36.6 C)] 97.8 F (36.6 C) (12/17 2148) Pulse Rate:  [78-93] 93 (12/17 2148) Resp:  [18] 18 (12/17 2148) BP: (103-110)/(48-73) 110/73 mmHg (12/17 2148) SpO2:  [99 %] 99 % (12/17 2148)  Physical Exam:  General: alert Lochia: appropriate Uterine Fundus: firm Incision: Dressing CDI DVT Evaluation: No evidence of DVT seen on physical exam. Negative Homan's sign. JP drain:   NA   Recent Labs  08/08/13 1210 08/10/13 0535  HGB 11.8* 9.6*  HCT 35.2* 28.8*    Assessment/Plan: Status post Cesarean section day 1 Relinquishing mother Hep C Doing well postoperatively.  Continue current care. Support to patient for concern about baby, and for plans to relinquish baby for adoption.   Kimberly Fritz 08/10/13,11p

## 2013-08-11 NOTE — Progress Notes (Signed)
Subjective: Postpartum Day 2: Cesarean Delivery due to repeat, with BTL Patient up ad lib, reports no syncope or dizziness. Feeding:  Bottle Contraceptive plan:  BTL Baby in NICU for hypoglycemia--doing well.  Objective: Vital signs in last 24 hours: Temp:  [97.5 F (36.4 C)-97.8 F (36.6 C)] 97.8 F (36.6 C) (12/18 0609) Pulse Rate:  [78-93] 88 (12/18 0609) Resp:  [18] 18 (12/18 0609) BP: (98-110)/(48-73) 98/62 mmHg (12/18 0609) SpO2:  [97 %-99 %] 97 % (12/18 4098)  Physical Exam:  General: alert Lochia: appropriate Uterine Fundus: firm Incision: Honeycomb dressing CDI DVT Evaluation: No evidence of DVT seen on physical exam. Negative Homan's sign. JP drain:   NA   Recent Labs  08/08/13 1210 08/10/13 0535  HGB 11.8* 9.6*  HCT 35.2* 28.8*    Assessment/Plan: Status post Cesarean section day 2. Doing well postoperatively. Patient planning adoption through [facility]--"waiting till the last minute to sign the papers" Continue current care. Plan for discharge tomorrow Support to patient for issues surrounding relinquishment.    Nigel Bridgeman 08/11/2013, 6:37 AM

## 2013-08-12 LAB — TYPE AND SCREEN
ABO/RH(D): O POS
Unit division: 0

## 2013-08-12 MED ORDER — OXYCODONE-ACETAMINOPHEN 5-325 MG PO TABS: 1.0000 | ORAL_TABLET | ORAL | Status: DC | PRN

## 2013-08-12 MED ORDER — FERROUS SULFATE 325 (65 FE) MG PO TABS: 325.0000 mg | ORAL_TABLET | Freq: Every day | ORAL | Status: DC

## 2013-08-12 MED ORDER — IBUPROFEN 600 MG PO TABS: 600.0000 mg | ORAL_TABLET | Freq: Four times a day (QID) | ORAL | Status: DC

## 2013-08-12 NOTE — Progress Notes (Signed)
Discharge instructions provided to patient at bedside.  Activity, medications, follow up appointments, incision care, when to call the doctor and community resources discussed.  Patient left unit in stable condition with all personal belongings and prescriptions.  Osvaldo Angst, RN--------------------------

## 2013-08-12 NOTE — Progress Notes (Signed)
Pt leaves the unit frequently and does not notify the unit where she is going. NICU has been called multiple times and says she is not down there.

## 2013-08-12 NOTE — Discharge Summary (Signed)
Cesarean Section Delivery Discharge Summary  Kimberly Fritz  DOB:    May 17, 1981 MRN:    [id number] CSN:    [id number]  Date of admission:                  08/09/13  Date of discharge:                   08/12/13  Procedures this admission: Scheduled repeat c/s  Date of Delivery: 08/09/13  Newborn Data:  Live born female  Birth Weight: 6 lb 6.5 oz (2905 g) APGAR: 8, 9  Home with Adoption. Circumcision Plan: unknown  History of Present Illness:  Ms. Kimberly Fritz is a 32 y.o. female, [phone], who presents at [redacted]w[redacted]d weeks gestation. The patient has been followed at the [facility] and Gynecology division of [facility].    Her pregnancy has been complicated by:  Patient Active Problem List   Diagnosis Date Noted  . Cesarean delivery delivered 08/09/2013     Hospital course:  The patient was admitted for scheduled repeat c/s.   Her postpartum course was complicated by asymptomatic anemia.  She was discharged to home on postpartum day 3 doing well.  Feeding:  (Adoption)  Contraception:  bilateral tubal ligation  Discharge hemoglobin:  Hemoglobin  Date Value Range Status  08/10/2013 9.6* 12.0 - 15.0 g/dL Final     DELTA CHECK NOTED     REPEATED TO VERIFY     HCT  Date Value Range Status  08/10/2013 28.8* 36.0 - 46.0 % Final    Discharge Physical Exam:   General: alert, cooperative and no distress Lochia: appropriate Uterine Fundus: firm Incision: healing well DVT Evaluation: No evidence of DVT seen on physical exam. Negative Homan's sign.  Intrapartum Procedures: cesarean: low cervical, transverse and tubal ligation Postpartum Procedures: none Complications-Operative and Postpartum: none  Discharge Diagnoses: Term Pregnancy-delivered and asymptomatic anemia  Discharge Information:  Activity:           pelvic rest Diet:                routine Medications: PNV, Ibuprofen, Iron and Percocet Condition:       stable Instructions:  Care After Cesarean Delivery  Refer to this sheet in the next few weeks. These instructions provide you with information on caring for yourself after your procedure. Your caregiver may also give you specific instructions. Your treatment has been planned according to current medical practices, but problems sometimes occur. Call your caregiver if you have any problems or questions after you go home. HOME CARE INSTRUCTIONS  Only take over-the-counter or prescription medicines as directed by your caregiver.  Do not drink alcohol, especially if you are breastfeeding or taking medicine to relieve pain.  Do not chew or smoke tobacco.  Continue to use good perineal care. Good perineal care includes:  Wiping your perineum from front to back.  Keeping your perineum clean.  Check your cut (incision) daily for increased redness, drainage, swelling, or separation of skin.  Clean your incision gently with soap and water every day, and then pat it dry. If your caregiver says it is okay, leave the incision uncovered. Use a bandage (dressing) if the incision is draining fluid or appears irritated. If the adhesive strips across the incision do not fall off within 7 days, carefully peel them off.  Hug a pillow when coughing or sneezing until your incision is healed. This helps to relieve pain.  Do not use tampons or  douche until your caregiver says it is okay.  Shower, wash your hair, and take tub baths as directed by your caregiver.  Wear a well-fitting bra that provides breast support.  Limit wearing support panties or control-top hose.  Drink enough fluids to keep your urine clear or pale yellow.  Eat high-fiber foods such as whole grain cereals and breads, brown rice, beans, and fresh fruits and vegetables every day. These foods may help prevent or relieve constipation.  Resume activities such as climbing stairs, driving, lifting, exercising, or traveling as directed by  your caregiver.  Talk to your caregiver about resuming sexual activities. This is dependent upon your risk of infection, your rate of healing, and your comfort and desire to resume sexual activity.  Try to have someone help you with your household activities and your newborn for at least a few days after you leave the hospital.  Rest as much as possible. Try to rest or take a nap when your newborn is sleeping.  Increase your activities gradually.  Keep all of your scheduled postpartum appointments. It is very important to keep your scheduled follow-up appointments. At these appointments, your caregiver will be checking to make sure that you are healing physically and emotionally. SEEK MEDICAL CARE IF:   You are passing large clots from your vagina. Save any clots to show your caregiver.  You have a foul smelling discharge from your vagina.  You have trouble urinating.  You are urinating frequently.  You have pain when you urinate.  You have a change in your bowel movements.  You have increasing redness, pain, or swelling near your incision.  You have pus draining from your incision.  Your incision is separating.  You have painful, hard, or reddened breasts.  You have a severe headache.  You have blurred vision or see spots.  You feel sad or depressed.  You have thoughts of hurting yourself or your newborn.  You have questions about your care, the care of your newborn, or medicines.  You are dizzy or lightheaded.  You have a rash.  You have pain, redness, or swelling at the site of the removed intravenous access (IV) tube.  You have nausea or vomiting.  You stopped breastfeeding and have not had a menstrual period within 12 weeks of stopping.  You are not breastfeeding and have not had a menstrual period within 12 weeks of delivery.  You have a fever. SEEK IMMEDIATE MEDICAL CARE IF:  You have persistent pain.  You have chest pain.  You have shortness of  breath.  You faint.  You have leg pain.  You have stomach pain.  Your vaginal bleeding saturates 2 or more sanitary pads in 1 hour. MAKE SURE YOU:   Understand these instructions.  Will watch your condition.  Will get help right away if you are not doing well or get worse. Document Released: 05/03/2002 Document Revised: 05/05/2012 Document Reviewed: 04/07/2012 [facility] Patient Information 2014 [place], [place].   Postpartum Depression and Baby Blues  The postpartum period begins right after the birth of a baby. During this time, there is often a great amount of joy and excitement. It is also a time of considerable changes in the life of the parent(s). Regardless of how many times a mother gives birth, each child brings new challenges and dynamics to the family. It is not unusual to have feelings of excitement accompanied by confusing shifts in moods, emotions, and thoughts. All mothers are at risk of developing postpartum depression  or the "baby blues." These mood changes can occur right after giving birth, or they may occur many months after giving birth. The baby blues or postpartum depression can be mild or severe. Additionally, postpartum depression can resolve rather quickly, or it can be a long-term condition. CAUSES Elevated hormones and their rapid decline are thought to be a main cause of postpartum depression and the baby blues. There are a number of hormones that radically change during and after pregnancy. Estrogen and progesterone usually decrease immediately after delivering your baby. The level of thyroid hormone and various cortisol steroids also rapidly drop. Other factors that play a major role in these changes include major life events and genetics.  RISK FACTORS If you have any of the following risks for the baby blues or postpartum depression, know what symptoms to watch out for during the postpartum period. Risk factors that may increase the likelihood of getting the  baby blues or postpartum depression include:  Havinga personal or family history of depression.  Having depression while being pregnant.  Having premenstrual or oral contraceptive-associated mood issues.  Having exceptional life stress.  Having marital conflict.  Lacking a social support network.  Having a baby with special needs.  Having health problems such as diabetes. SYMPTOMS Baby blues symptoms include:  Brief fluctuations in mood, such as going from extreme happiness to sadness.  Decreased concentration.  Difficulty sleeping.  Crying spells, tearfulness.  Irritability.  Anxiety. Postpartum depression symptoms typically begin within the first month after giving birth. These symptoms include:  Difficulty sleeping or excessive sleepiness.  Marked weight loss.  Agitation.  Feelings of worthlessness.  Lack of interest in activity or food. Postpartum psychosis is a very concerning condition and can be dangerous. Fortunately, it is rare. Displaying any of the following symptoms is cause for immediate medical attention. Postpartum psychosis symptoms include:  Hallucinations and delusions.  Bizarre or disorganized behavior.  Confusion or disorientation. DIAGNOSIS  A diagnosis is made by an evaluation of your symptoms. There are no medical or lab tests that lead to a diagnosis, but there are various questionnaires that a caregiver may use to identify those with the baby blues, postpartum depression, or psychosis. Often times, a screening tool called the [place] Postnatal Depression Scale is used to diagnose depression in the postpartum period.  TREATMENT The baby blues usually goes away on its own in 1 to 2 weeks. Social support is often all that is needed. You should be encouraged to get adequate sleep and rest. Occasionally, you may be given medicines to help you sleep.  Postpartum depression requires treatment as it can last several months or longer if it is  not treated. Treatment may include individual or group therapy, medicine, or both to address any social, physiological, and psychological factors that may play a role in the depression. Regular exercise, a healthy diet, rest, and social support may also be strongly recommended.  Postpartum psychosis is more serious and needs treatment right away. Hospitalization is often needed. HOME CARE INSTRUCTIONS  Get as much rest as you can. Nap when the baby sleeps.  Exercise regularly. Some women find yoga and walking to be beneficial.  Eat a balanced and nourishing diet.  Do little things that you enjoy. Have a cup of tea, take a bubble bath, read your favorite magazine, or listen to your favorite music.  Avoid alcohol.  Ask for help with household chores, cooking, grocery shopping, or running errands as needed. Do not try to do everything.  Talk to people close to you about how you are feeling. Get support from your partner, family members, friends, or other new moms.  Try to stay positive in how you think. Think about the things you are grateful for.  Do not spend a lot of time alone.  Only take medicine as directed by your caregiver.  Keep all your postpartum appointments.  Let your caregiver know if you have any concerns. SEEK MEDICAL CARE IF: You are having a reaction or problems with your medicine. SEEK IMMEDIATE MEDICAL CARE IF:  You have suicidal feelings.  You feel you may harm the baby or someone else. Document Released: 05/15/2004 Document Revised: 11/03/2011 Document Reviewed: 06/17/2011 [facility] Patient Information 2014 [place], [place].  Discharge to: home  Follow-up Information   Follow up with [facility]. Schedule an appointment as soon as possible for a visit in 6 weeks. (Call with any questions or concerns.)    Specialty:  Obstetrics and Gynecology   Contact information:   [address] [place]  [address] [phone]       Kimberly Fritz 08/12/2013

## 2013-08-19 ENCOUNTER — Emergency Department (HOSPITAL_COMMUNITY)
Admission: EM | Admit: 2013-08-19 | Discharge: 2013-08-19 | Disposition: A | Discharge status: Home / Self Care | Payer: BC Managed Care – PPO | Attending: Emergency Medicine | Admitting: Emergency Medicine

## 2013-08-19 ENCOUNTER — Emergency Department (HOSPITAL_COMMUNITY): Payer: BC Managed Care – PPO

## 2013-08-19 ENCOUNTER — Encounter (HOSPITAL_COMMUNITY): Payer: Self-pay | Admitting: Emergency Medicine

## 2013-08-19 DIAGNOSIS — J189 Pneumonia, unspecified organism: Secondary | ICD-10-CM

## 2013-08-19 DIAGNOSIS — Z8619 Personal history of other infectious and parasitic diseases: Secondary | ICD-10-CM | POA: Insufficient documentation

## 2013-08-19 DIAGNOSIS — K219 Gastro-esophageal reflux disease without esophagitis: Secondary | ICD-10-CM | POA: Insufficient documentation

## 2013-08-19 DIAGNOSIS — Z79899 Other long term (current) drug therapy: Secondary | ICD-10-CM | POA: Insufficient documentation

## 2013-08-19 DIAGNOSIS — J159 Unspecified bacterial pneumonia: Secondary | ICD-10-CM | POA: Insufficient documentation

## 2013-08-19 DIAGNOSIS — Z9089 Acquired absence of other organs: Secondary | ICD-10-CM | POA: Insufficient documentation

## 2013-08-19 DIAGNOSIS — F17200 Nicotine dependence, unspecified, uncomplicated: Secondary | ICD-10-CM | POA: Insufficient documentation

## 2013-08-19 DIAGNOSIS — Z791 Long term (current) use of non-steroidal anti-inflammatories (NSAID): Secondary | ICD-10-CM | POA: Insufficient documentation

## 2013-08-19 DIAGNOSIS — Z792 Long term (current) use of antibiotics: Secondary | ICD-10-CM | POA: Insufficient documentation

## 2013-08-19 DIAGNOSIS — M7989 Other specified soft tissue disorders: Secondary | ICD-10-CM | POA: Insufficient documentation

## 2013-08-19 DIAGNOSIS — Z8659 Personal history of other mental and behavioral disorders: Secondary | ICD-10-CM | POA: Insufficient documentation

## 2013-08-19 MED ORDER — ALBUTEROL SULFATE (5 MG/ML) 0.5% IN NEBU
5.0000 mg | INHALATION_SOLUTION | Freq: Once | RESPIRATORY_TRACT | Status: AC
  Administered 2013-08-19: 5 mg via RESPIRATORY_TRACT
  Filled 2013-08-19: qty 1

## 2013-08-19 MED ORDER — ALBUTEROL SULFATE HFA 108 (90 BASE) MCG/ACT IN AERS: 2.0000 | INHALATION_SPRAY | RESPIRATORY_TRACT | Status: AC | PRN

## 2013-08-19 MED ORDER — AZITHROMYCIN 250 MG PO TABS: 250.0000 mg | ORAL_TABLET | Freq: Every day | ORAL | Status: AC

## 2013-08-19 NOTE — ED Provider Notes (Signed)
CSN: [id number]     Arrival date & time 08/19/13  1533 History   First MD Initiated Contact with Patient 08/19/13 1713     Chief Complaint  Patient presents with  . Generalized Body Aches   (Consider location/radiation/quality/duration/timing/severity/associated sxs/prior Treatment) HPI 32 yo female 1 week s/p scheduled C-section delivery presents with 4 days of progressively worsening dyspnea on exertion. Patient is a current smoker though has not smoked in the past week. Admits to fever, chills, body aches, and non productive cough. Patient admits to pleuritic chest pain worsened with deep inspiration. Denies radiation to shoulder, jaw, or neck. Denies hemoptysis. Tried OTC medication for cold sxs without any success.  PMH significant for GERD, Hepatitis, and Anxiety.  Past Medical History  Diagnosis Date  . SVD (spontaneous vaginal delivery) 2001,2009    x 2  . Headache(784.0)   . GERD (gastroesophageal reflux disease)   . Herpes   . Anxiety     history - no meds x 1 yr  . Hepatitis     Hep C - dx 2 yrs ago - no meds - levels checked monthly   Past Surgical History  Procedure Laterality Date  . Cesarean section  2012    x 1  . Tonsillectomy and adenoidectomy    . Wisdom tooth extraction    . Cesarean section with bilateral tubal ligation Bilateral 08/09/2013    Procedure: REPEAT CESAREAN SECTION WITH BILATERAL TUBAL LIGATION;  Surgeon: Kirkland Hun, MD;  Location: [facility];  Service: Obstetrics;  Laterality: Bilateral;   Family History  Problem Relation Age of Onset  . Epilepsy Mother   . Cancer Maternal Grandmother    History  Substance Use Topics  . Smoking status: Current Every Day Smoker -- 0.25 packs/day for 15 years    Types: Cigarettes  . Smokeless tobacco: Never Used     Comment: Pt states smoke 6 cigarettes a day and would like to quit  . Alcohol Use: No   OB History   Grav Para Term Preterm Abortions TAB SAB Ect Mult Living   4 4 2 2      3      Review  of Systems  Constitutional: Positive for fever and chills.  HENT: Positive for congestion. Negative for sinus pressure and sore throat.   Respiratory: Positive for cough, chest tightness and shortness of breath.   Cardiovascular: Positive for chest pain and leg swelling (Bilateral), present since pregnancy.. Negative for palpitations.  Gastrointestinal: Negative for nausea, vomiting and abdominal pain.  Genitourinary: Negative for dysuria and hematuria.  Musculoskeletal: Negative for neck pain and neck stiffness.  Skin: Negative for rash.  Neurological: Negative for headaches.    Allergies  Review of patient's allergies indicates no known allergies.  Home Medications   Current Outpatient Rx  Name  Route  Sig  Dispense  Refill  . butalbital-acetaminophen-caffeine (FIORICET) 50-325-40 MG per tablet   Oral   Take 1-2 tablets by mouth every 6 (six) hours as needed for headache.   20 tablet   1   . Camphor-Eucalyptus-Menthol (VICKS VAPORUB) 4.7-1.2-2.6 % OINT   Apply externally   Apply 1 application topically every 6 (six) hours as needed (congestion).         . ferrous sulfate 325 (65 FE) MG tablet   Oral   Take 1 tablet (325 mg total) by mouth daily with breakfast.   30 tablet   3   . ibuprofen (ADVIL,MOTRIN) 600 MG tablet   Oral   Take  1 tablet (600 mg total) by mouth every 6 (six) hours.   30 tablet   0   . Pseudoeph-Doxylamine-DM-APAP (DAYQUIL/NYQUIL COLD/FLU RELIEF PO)   Oral   Take 2 capsules by mouth at bedtime as needed (congestion).         . ranitidine (ZANTAC) 150 MG tablet   Oral   Take 150 mg by mouth daily.         . vitamin C (ASCORBIC ACID) 500 MG tablet   Oral   Take 1,000 mg by mouth 2 (two) times daily.          Marland Kitchen albuterol (PROVENTIL HFA;VENTOLIN HFA) 108 (90 BASE) MCG/ACT inhaler   Inhalation   Inhale 2 puffs into the lungs every 4 (four) hours as needed for wheezing or shortness of breath.   1 Inhaler   0   . azithromycin  (ZITHROMAX) 250 MG tablet   Oral   Take 1 tablet (250 mg total) by mouth daily. Take first 2 tablets together, then 1 every day until finished.   6 tablet   0    BP 152/88  Pulse 69  Temp(Src) 98.3 F (36.8 C) (Oral)  Resp 18  SpO2 92%  LMP 11/09/2012 Physical Exam  Nursing note and vitals reviewed. Constitutional: She is oriented to person, place, and time. She appears well-developed and well-nourished. No distress.  HENT:  Head: Normocephalic and atraumatic.  Eyes: Conjunctivae and EOM are normal. Pupils are equal, round, and reactive to light. No scleral icterus.  Neck: Normal range of motion. Neck supple. No JVD present.  Cardiovascular: Normal rate, regular rhythm and normal heart sounds.  Exam reveals no gallop and no friction rub.   No murmur heard. Pulmonary/Chest: Effort normal. No respiratory distress. She has wheezes. She has rales in the right lower field. She exhibits tenderness.  Abdominal: Soft. Bowel sounds are normal. There is no tenderness.  Musculoskeletal: Normal range of motion. She exhibits edema (1+ pitting edema bilaterally).  Lymphadenopathy:    She has no cervical adenopathy.  Neurological: She is alert and oriented to person, place, and time.  Skin: Skin is warm and dry. No rash noted. She is not diaphoretic.  Psychiatric: She has a normal mood and affect. Her behavior is normal.    ED Course  Procedures (including critical care time) Labs Review Labs Reviewed - No data to display Imaging Review Dg Chest 2 View  08/19/2013   CLINICAL DATA:  Chest congestion, nonproductive cough, and chills, 11 days status post Cesarean section, history of tobacco use.  EXAM: CHEST  2 VIEW  COMPARISON:  None.  FINDINGS: The lungs are hypoinflated. There are coarse increased interstitial markings diffusely with areas of confluence. There is pleural fluid on the right blunting the costophrenic angles. The cardiopericardial silhouette is top-normal in size. The pulmonary  vascularity is indistinct. There is no pneumothorax. The observed portions of the bony thorax appear normal.  IMPRESSION: The findings are consistent with bilateral interstitial and early alveolar pneumonia. A small right pleural effusion is present. There may be an element of CHF present but the pulmonary vascularity is indistinct and the cardiac silhouette does not appear significantly enlarged.   Electronically Signed   By: David  [place]   On: 08/19/2013 17:47    EKG Interpretation   None       MDM   1. CAP (community acquired pneumonia)    CXR consistent with bilateral interstitial and early alveolar pneumonia. Small right pleural effusion.  Possible element of  CHF. Cardiac silhouette not significantly enlarged.   Patient ambulated in hall with pulse oximetry and O2 SAT stable.   Follow up with a primary care provider in 1-2 days to ensure improvement of symptoms. Resource guide attached for follow up reference. Take medications as prescribed. Practice good hand hygiene to avoid spreading germs. Return to ED if symptoms worsen (increased leg swelling, Increased shortness of breath, Heart rate greater than 110). Discussed imaging, and exam findings with patient.Patient agrees with plan. Discharged in good condition.   Meds given in ED:  Medications  albuterol (PROVENTIL) (5 MG/ML) 0.5% nebulizer solution 5 mg (5 mg Nebulization Given 08/19/13 1712)    Discharge Medication List as of 08/19/2013  6:37 PM    START taking these medications   Details  albuterol (PROVENTIL HFA;VENTOLIN HFA) 108 (90 BASE) MCG/ACT inhaler Inhale 2 puffs into the lungs every 4 (four) hours as needed for wheezing or shortness of breath., Starting 08/19/2013, Until Discontinued, Print    azithromycin (ZITHROMAX) 250 MG tablet Take 1 tablet (250 mg total) by mouth daily. Take first 2 tablets together, then 1 every day until finished., Starting 08/19/2013, Until Discontinued, Print             Rudene Anda, [place] 08/20/13 0040

## 2013-08-19 NOTE — ED Notes (Addendum)
Pt reports developing flu like symptoms on Sunday. Pt reports having generalized body aches, fever, and a productive cough with yellow sputum. Pt states she has been short of breath, especially while conducting activities. Pt states she was discharged last week after having a cesarean section. Pt denies any complications from the surgery. Pt has an oxygen saturation of 92 % on room air during triage. Pt is A/O x4.

## 2013-08-20 NOTE — ED Provider Notes (Signed)
Medical screening examination/treatment/procedure(s) were conducted as a shared visit with non-physician practitioner(s) and myself.  I personally evaluated the patient during the encounter.   I interviewed and examined the patient. Lungs are CTAB. Cardiac exam wnl. Abdomen soft.  The pt is s/p c-section 1 week ago. Denies complications during her pregnancy. Review of notes states she did have asx anemia. She has had HA's throughout the end of her pregnancy which have continued. Also mild pitting edema in her LE's which has not significantly changed. She is here for new onset cough and mild sob. CXR here c/w PNA, also small right pleural effusion and possible an element of chf but cardiac silhouette not significantly enlarged. She appears well on exam, no abd ttp or N/V. She is able to ambulate and maintain her O2 saturation. Mildly hypertensive here. I suspect this is pna and will tx w/ azithro. Pt not breast feeding. Will rec close f/u w/ her obgyn in 2 days.   08/20/13 12:30 PM I called today to check on the pt. She states she is doing well, sob has improved. She continues to cough. As HELP syn and other post partum complications are still within the realm of possibility I told her to come back to the ED or [facility] for screening blood work for any worsening. She understands and will comply. Otherwise I think its ok to f/u w/ her obgyn this Monday.   Junius Argyle, MD 08/20/13 [phone]

## 2014-06-26 ENCOUNTER — Encounter (HOSPITAL_COMMUNITY): Payer: Self-pay | Admitting: Emergency Medicine

## 2014-09-09 IMAGING — CR DG CHEST 2V
2 series · 2 of 2 positions shown · non-contrast
Comparison: None.

CLINICAL DATA: Chest congestion, nonproductive cough, and chills,
11 days status post Cesarean section, history of tobacco use.

EXAM:
CHEST  2 VIEW

[w chest pa]
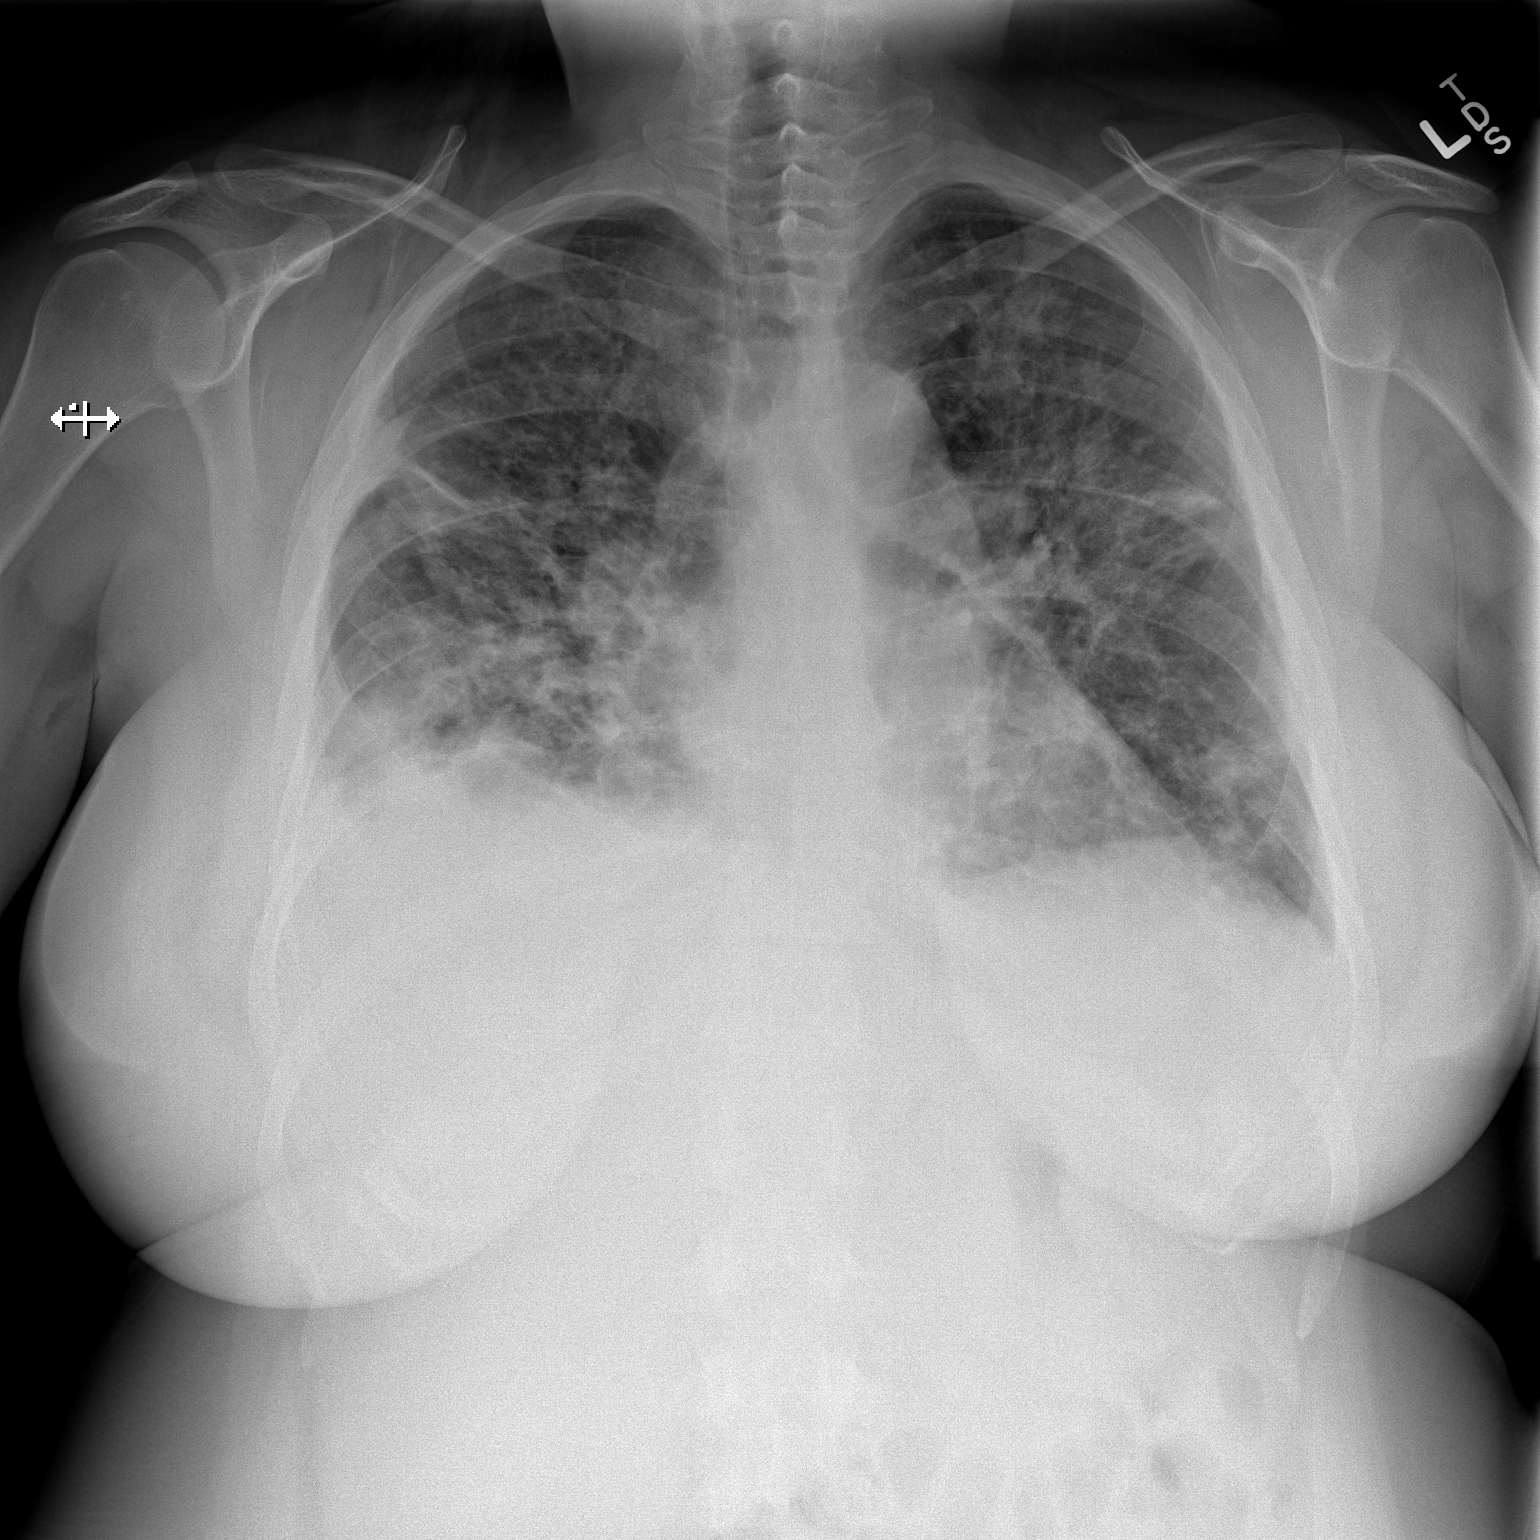

[w chest lat]
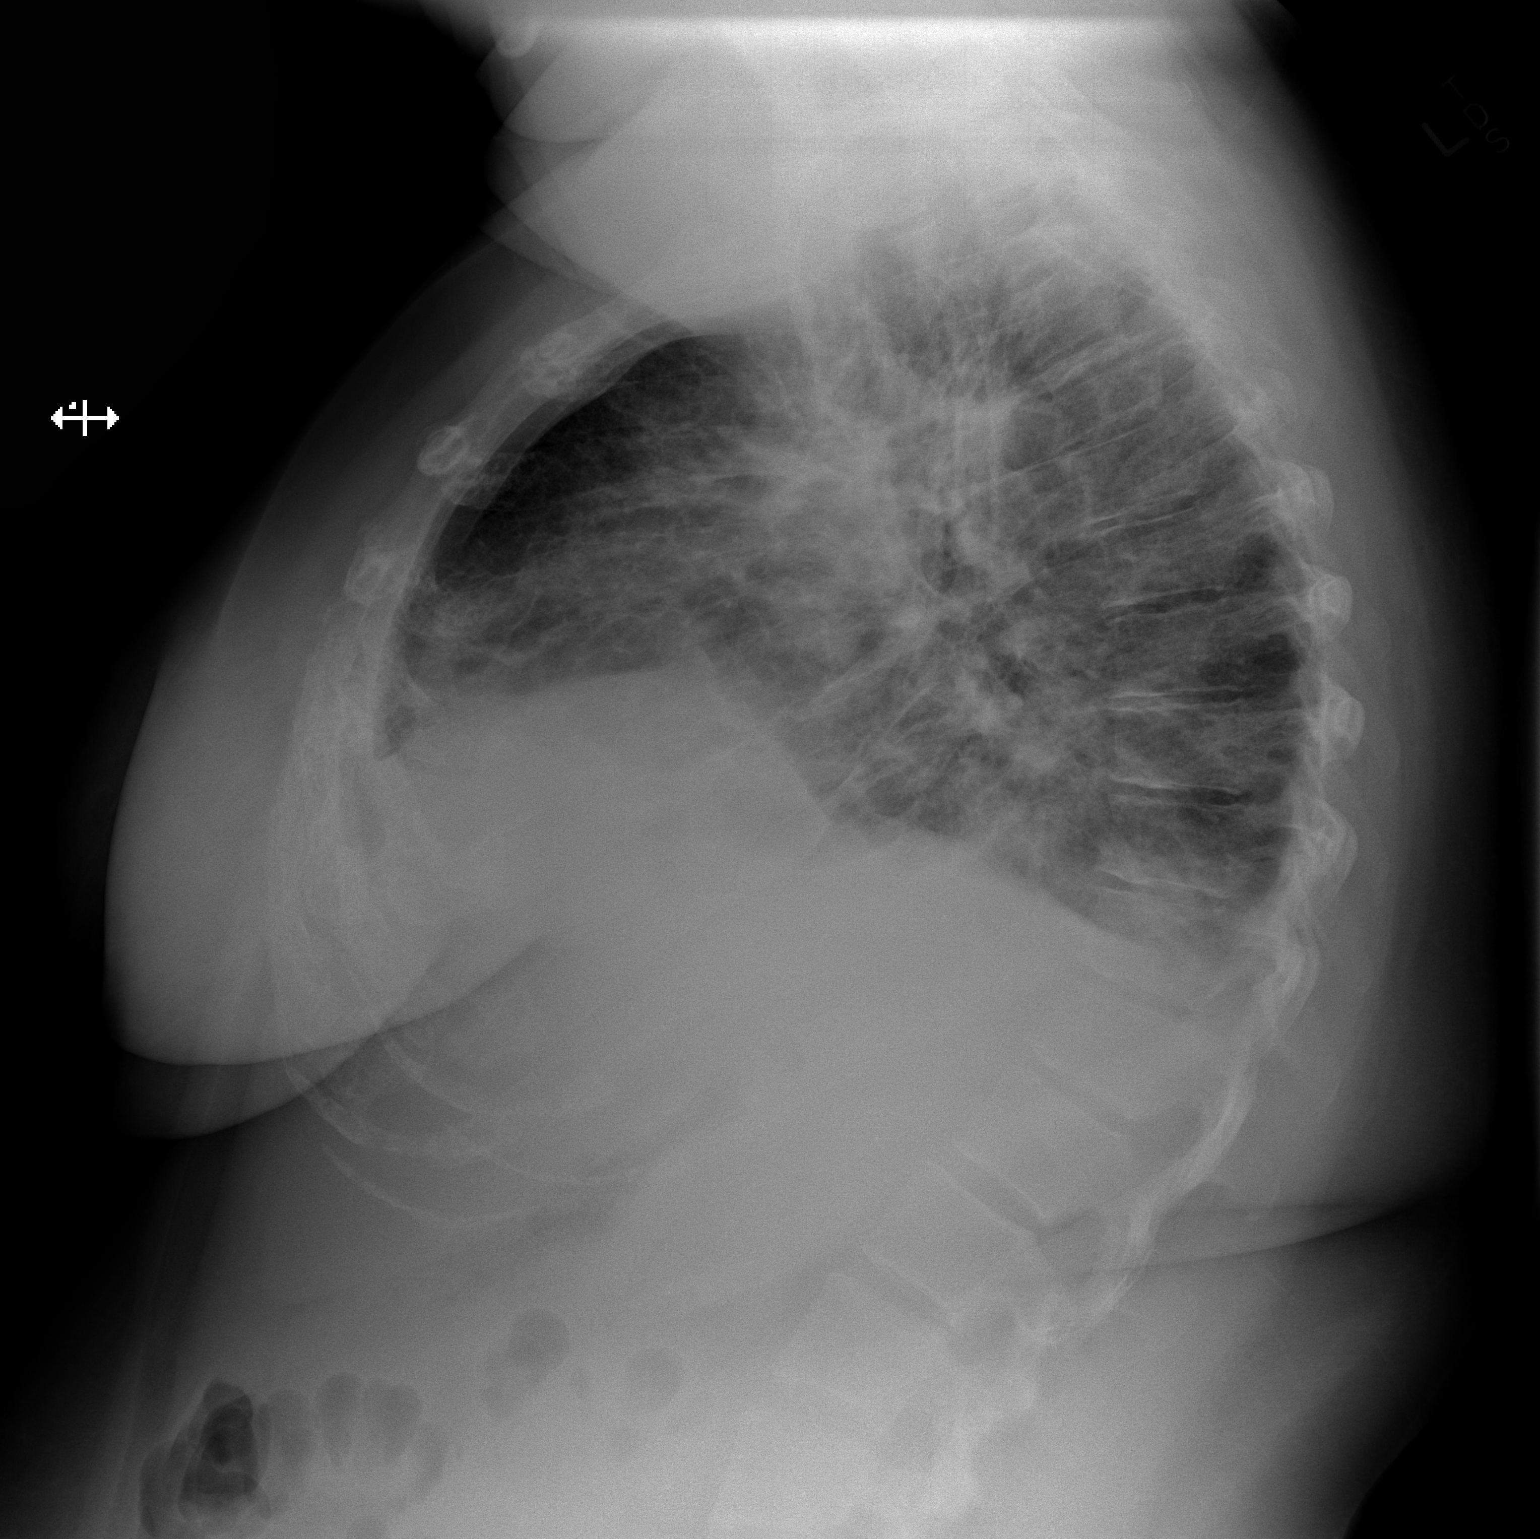

[2 of 2 positions shown; findings below may reference images not displayed]

FINDINGS: The lungs are hypoinflated. There are coarse increased interstitial
markings diffusely with areas of confluence. There is pleural fluid
on the right blunting the costophrenic angles. The cardiopericardial
silhouette is top-normal in size. The pulmonary vascularity is
indistinct. There is no pneumothorax. The observed portions of the
bony thorax appear normal.
IMPRESSION: The findings are consistent with bilateral interstitial and early
alveolar pneumonia. A small right pleural effusion is present. There
may be an element of CHF present but the pulmonary vascularity is
indistinct and the cardiac silhouette does not appear significantly
enlarged.

## 2014-11-16 ENCOUNTER — Emergency Department (HOSPITAL_BASED_OUTPATIENT_CLINIC_OR_DEPARTMENT_OTHER)
Admission: EM | Admit: 2014-11-16 | Discharge: 2014-11-16 | Disposition: A | Discharge status: Home / Self Care | Payer: BLUE CROSS/BLUE SHIELD | Attending: Emergency Medicine | Admitting: Emergency Medicine

## 2014-11-16 ENCOUNTER — Encounter (HOSPITAL_BASED_OUTPATIENT_CLINIC_OR_DEPARTMENT_OTHER): Payer: Self-pay | Admitting: *Deleted

## 2014-11-16 DIAGNOSIS — Z792 Long term (current) use of antibiotics: Secondary | ICD-10-CM | POA: Diagnosis not present

## 2014-11-16 DIAGNOSIS — Z79899 Other long term (current) drug therapy: Secondary | ICD-10-CM | POA: Insufficient documentation

## 2014-11-16 DIAGNOSIS — F419 Anxiety disorder, unspecified: Secondary | ICD-10-CM | POA: Diagnosis not present

## 2014-11-16 DIAGNOSIS — Z720 Tobacco use: Secondary | ICD-10-CM | POA: Insufficient documentation

## 2014-11-16 DIAGNOSIS — M542 Cervicalgia: Secondary | ICD-10-CM | POA: Diagnosis present

## 2014-11-16 DIAGNOSIS — M436 Torticollis: Secondary | ICD-10-CM | POA: Diagnosis not present

## 2014-11-16 DIAGNOSIS — Z8619 Personal history of other infectious and parasitic diseases: Secondary | ICD-10-CM | POA: Diagnosis not present

## 2014-11-16 DIAGNOSIS — K219 Gastro-esophageal reflux disease without esophagitis: Secondary | ICD-10-CM | POA: Diagnosis not present

## 2014-11-16 MED ORDER — DIAZEPAM 5 MG PO TABS: 5.0000 mg | ORAL_TABLET | Freq: Four times a day (QID) | ORAL | Status: DC | PRN

## 2014-11-16 MED ORDER — KETOROLAC TROMETHAMINE 60 MG/2ML IM SOLN
60.0000 mg | Freq: Once | INTRAMUSCULAR | Status: AC
  Administered 2014-11-16: 60 mg via INTRAMUSCULAR
  Filled 2014-11-16: qty 2

## 2014-11-16 MED ORDER — IBUPROFEN 800 MG PO TABS: 800.0000 mg | ORAL_TABLET | Freq: Three times a day (TID) | ORAL | Status: AC

## 2014-11-16 NOTE — ED Notes (Signed)
Neck pain since last night. Pain has gotten worse throughout the day. Feels like she felt wrong or she is afraid she has a pinched nerve.

## 2014-11-16 NOTE — Discharge Instructions (Signed)

## 2014-11-16 NOTE — ED Provider Notes (Signed)
CSN: [id number]     Arrival date & time 11/16/14  1617 History   First MD Initiated Contact with Patient 11/16/14 1649     Chief Complaint  Patient presents with  . Neck Pain     (Consider location/radiation/quality/duration/timing/severity/associated sxs/prior Treatment) HPI Comments: Patient presents to the ER for evaluation of neck pain and stiffness. Patient reports that she started having a slight "crick" in her neck last night. When she woke up it was much worse. She reports that she went to work thinking it would work itself out, but over the course of today the pain worsened. She is complaining of sharp and stabbing pain on the right side of her neck that significantly worsens if she turns her head. She is having trouble turning her head to the left side because of increased pain. No numbness, tingling or weakness in the upper extremities. She denies injury. She has not had fever.  Patient is a 34 y.o. female presenting with neck pain.  Neck Pain   Past Medical History  Diagnosis Date  . SVD (spontaneous vaginal delivery) 2001,2009    x 2  . Headache(784.0)   . GERD (gastroesophageal reflux disease)   . Herpes   . Anxiety     history - no meds x 1 yr  . Hepatitis     Hep C - dx 2 yrs ago - no meds - levels checked monthly   Past Surgical History  Procedure Laterality Date  . Cesarean section  2012    x 1  . Tonsillectomy and adenoidectomy    . Wisdom tooth extraction    . Cesarean section with bilateral tubal ligation Bilateral 08/09/2013    Procedure: REPEAT CESAREAN SECTION WITH BILATERAL TUBAL LIGATION;  Surgeon: Kirkland HunArthur Stringer, MD;  Location: [facility];  Service: Obstetrics;  Laterality: Bilateral;   Family History  Problem Relation Age of Onset  . Epilepsy Mother   . Cancer Maternal Grandmother    History  Substance Use Topics  . Smoking status: Current Every Day Smoker -- 0.25 packs/day for 15 years    Types: Cigarettes  . Smokeless tobacco: Never Used   Comment: Pt states smoke 6 cigarettes a day and would like to quit  . Alcohol Use: No   OB History    Gravida Para Term Preterm AB TAB SAB Ectopic Multiple Living   4 4 2 2      3      Review of Systems  Musculoskeletal: Positive for neck pain.  All other systems reviewed and are negative.     Allergies  Review of patient's allergies indicates no known allergies.  Home Medications   Prior to Admission medications   Medication Sig Start Date End Date Taking? Authorizing Provider  albuterol (PROVENTIL HFA;VENTOLIN HFA) 108 (90 BASE) MCG/ACT inhaler Inhale 2 puffs into the lungs every 4 (four) hours as needed for wheezing or shortness of breath. 08/19/13   Cristobal GoldmannJacob Lackey, PA-C  azithromycin (ZITHROMAX) 250 MG tablet Take 1 tablet (250 mg total) by mouth daily. Take first 2 tablets together, then 1 every day until finished. 08/19/13   Cristobal GoldmannJacob Lackey, PA-C  butalbital-acetaminophen-caffeine (FIORICET) [id number] MG per tablet Take 1-2 tablets by mouth every 6 (six) hours as needed for headache. 01/31/13   Dorathy KinsmanVirginia Smith, CNM  Camphor-Eucalyptus-Menthol (VICKS VAPORUB) 4.7-1.2-2.6 % OINT Apply 1 application topically every 6 (six) hours as needed (congestion).    Historical Provider, MD  ferrous sulfate 325 (65 FE) MG tablet Take 1 tablet (325 mg total)  by mouth daily with breakfast. 08/12/13   Haroldine Laws, CNM  ibuprofen (ADVIL,MOTRIN) 600 MG tablet Take 1 tablet (600 mg total) by mouth every 6 (six) hours. 08/12/13   Haroldine Laws, CNM  Pseudoeph-Doxylamine-DM-APAP (DAYQUIL/NYQUIL COLD/FLU RELIEF PO) Take 2 capsules by mouth at bedtime as needed (congestion).    Historical Provider, MD  ranitidine (ZANTAC) 150 MG tablet Take 150 mg by mouth daily.    Historical Provider, MD  vitamin C (ASCORBIC ACID) 500 MG tablet Take 1,000 mg by mouth 2 (two) times daily.     Historical Provider, MD   BP 126/77 mmHg  Pulse 98  Temp(Src) 98.3 F (36.8 C) (Oral)  Resp 18  Ht  (1.676 m)  Wt 200 lb  (90.719 kg)  BMI 32.30 kg/m2  SpO2 97%  LMP 11/16/2014 Physical Exam  Constitutional: She is oriented to person, place, and time. She appears well-developed and well-nourished. No distress.  HENT:  Head: Normocephalic and atraumatic.  Right Ear: Hearing normal.  Left Ear: Hearing normal.  Nose: Nose normal.  Mouth/Throat: Oropharynx is clear and moist and mucous membranes are normal.  Eyes: Conjunctivae and EOM are normal. Pupils are equal, round, and reactive to light.  Neck: Neck supple. Muscular tenderness present. No spinous process tenderness present. Decreased range of motion (secondary to spasm) present.    Cardiovascular: Regular rhythm, S1 normal and S2 normal.  Exam reveals no gallop and no friction rub.   No murmur heard. Pulmonary/Chest: Effort normal and breath sounds normal. No respiratory distress. She exhibits no tenderness.  Abdominal: Soft. Normal appearance and bowel sounds are normal. There is no hepatosplenomegaly. There is no tenderness. There is no rebound, no guarding, no tenderness at McBurney's point and negative Murphy's sign. No hernia.  Neurological: She is alert and oriented to person, place, and time. She has normal strength. No cranial nerve deficit or sensory deficit. Coordination normal. GCS eye subscore is 4. GCS verbal subscore is 5. GCS motor subscore is 6.  Skin: Skin is warm, dry and intact. No rash noted. No cyanosis.  Psychiatric: She has a normal mood and affect. Her speech is normal and behavior is normal. Thought content normal.  Nursing note and vitals reviewed.   ED Course  Procedures (including critical care time) Labs Review Labs Reviewed - No data to display  Imaging Review No results found.   EKG Interpretation None      MDM   Final diagnoses:  None  torticollis  She presents to the ER for evaluation of right-sided neck pain. Patient reports progressive worsening of pain since last night. She is not experiencing  significant right paraspinal muscle spasm consistent with torticollis. No concern for infection. She has not had any injury, no deformity for imaging. Patient will be treated with Toradol here in the ER, Valium and ibuprofen at home.    Gilda Crease, MD 11/16/14 1726

## 2016-08-25 ENCOUNTER — Encounter (HOSPITAL_COMMUNITY): Payer: Self-pay

## 2016-08-25 ENCOUNTER — Emergency Department (HOSPITAL_COMMUNITY)
Admission: EM | Admit: 2016-08-25 | Discharge: 2016-08-25 | Disposition: A | Discharge status: Home / Self Care | Payer: BLUE CROSS/BLUE SHIELD | Attending: Emergency Medicine | Admitting: Emergency Medicine

## 2016-08-25 DIAGNOSIS — T401X1A Poisoning by heroin, accidental (unintentional), initial encounter: Secondary | ICD-10-CM | POA: Insufficient documentation

## 2016-08-25 DIAGNOSIS — F17210 Nicotine dependence, cigarettes, uncomplicated: Secondary | ICD-10-CM | POA: Insufficient documentation

## 2016-08-25 DIAGNOSIS — Z79899 Other long term (current) drug therapy: Secondary | ICD-10-CM | POA: Insufficient documentation

## 2016-08-25 LAB — CBG MONITORING, ED: GLUCOSE-CAPILLARY: 100 mg/dL — AB (ref 65–99)

## 2016-08-25 MED ORDER — ONDANSETRON 4 MG PO TBDP
4.0000 mg | ORAL_TABLET | Freq: Once | ORAL | Status: AC | PRN
  Administered 2016-08-25: 4 mg via ORAL
  Filled 2016-08-25: qty 1

## 2016-08-25 MED ORDER — ONDANSETRON HCL 4 MG/2ML IJ SOLN
4.0000 mg | Freq: Once | INTRAMUSCULAR | Status: AC
  Administered 2016-08-25: 4 mg via INTRAVENOUS
  Filled 2016-08-25: qty 2

## 2016-08-25 MED ORDER — SODIUM CHLORIDE 0.9 % IV BOLUS (SEPSIS)
1000.0000 mL | Freq: Once | INTRAVENOUS | Status: AC
  Administered 2016-08-25: 1000 mL via INTRAVENOUS

## 2016-08-25 NOTE — ED Notes (Signed)
I brought her sprite and crackers per her request. Will check back shortly to see how she tolerates them.

## 2016-08-25 NOTE — ED Notes (Signed)
She remains awake and mildly drowsy. Her skin is normal, warm and dry and she is breathing normally.

## 2016-08-25 NOTE — ED Notes (Signed)
Pt refusing blood draw.

## 2016-08-25 NOTE — ED Triage Notes (Signed)
Friends phoned EMS when pt. Was found to be unarouseable after having injecting some heroin this morning. She was given 4mg  of intranasal narcan; and when paramedics arrived they found pt. To be awake and sitting upright on the floor and in no distress. She arrives here mildly drowsy and in no distress. Her only c/o is of nausea.

## 2016-08-25 NOTE — ED Notes (Signed)
Bed: WA17 Expected date: 08/25/16 Expected time: 8:38 AM Means of arrival: Ambulance Comments: OD 4 mg Narcan

## 2016-08-25 NOTE — ED Provider Notes (Signed)
[facility]-EMERGENCY DEPT Provider Note   CSN: [id number] Arrival date & time: 08/25/16 0848     History    Chief Complaint  Patient presents with  . Drug Overdose     HPI Kimberly Fritz is a 36 y.o. female. 36yo F w/ PMH including substance abuse, Hep C who p/w overdose. Patient was brought in by EMS after friends called when they found the patient unresponsive after injecting heroin this morning. She was given 4 mg intranasal Narcan and on paramedic arrival, she was awake and sitting up, no acute distress. She currently complains of nausea and has had a few episodes of vomiting. She denies any complaints of pain. She admits to drinking alcohol and using some marijuana earlier in the night and states that this is the first time she has injected heroin in 4 years. She denies any skin infections or rashes. She denies any desire for self-harm or SI.  Past Medical History:  Diagnosis Date  . Anxiety    history - no meds x 1 yr  . GERD (gastroesophageal reflux disease)   . Headache(784.0)   . Hepatitis    Hep C - dx 2 yrs ago - no meds - levels checked monthly  . Herpes   . SVD (spontaneous vaginal delivery) 2001,2009   x 2     Patient Active Problem List   Diagnosis Date Noted  . Cesarean delivery delivered 08/09/2013    Past Surgical History:  Procedure Laterality Date  . CESAREAN SECTION  2012   x 1  . CESAREAN SECTION WITH BILATERAL TUBAL LIGATION Bilateral 08/09/2013   Procedure: REPEAT CESAREAN SECTION WITH BILATERAL TUBAL LIGATION;  Surgeon: Kirkland Hun, MD;  Location: [facility];  Service: Obstetrics;  Laterality: Bilateral;  . TONSILLECTOMY AND ADENOIDECTOMY    . WISDOM TOOTH EXTRACTION      OB History    Gravida Para Term Preterm AB Living   4 4 2 2   3    SAB TAB Ectopic Multiple Live Births           3        Home Medications    Prior to Admission medications   Medication Sig Start Date End Date Taking? Authorizing Provider  acetaminophen (TYLENOL) 500  MG tablet Take 1,000 mg by mouth every 6 (six) hours as needed.   Yes Historical Provider, MD  albuterol (PROVENTIL HFA;VENTOLIN HFA) 108 (90 BASE) MCG/ACT inhaler Inhale 2 puffs into the lungs every 4 (four) hours as needed for wheezing or shortness of breath. 08/19/13  Yes Cristobal Goldmann, PA-C  clindamycin (CLEOCIN) 300 MG capsule Take 300 mg by mouth 2 (two) times daily. 07/15/16 08/26/16 Yes Historical Provider, MD  azithromycin (ZITHROMAX) 250 MG tablet Take 1 tablet (250 mg total) by mouth daily. Take first 2 tablets together, then 1 every day until finished. Patient not taking: Reported on 08/25/2016 08/19/13   Cristobal Goldmann, PA-C  ibuprofen (ADVIL,MOTRIN) 800 MG tablet Take 1 tablet (800 mg total) by mouth 3 (three) times daily. Patient not taking: Reported on 08/25/2016 11/16/14   Gilda Crease, MD      Family History  Problem Relation Age of Onset  . Epilepsy Mother   . Cancer Maternal Grandmother      Social History  Substance Use Topics  . Smoking status: Current Every Day Smoker    Packs/day: 0.25    Years: 15.00    Types: Cigarettes  . Smokeless tobacco: Never Used     Comment: Pt states smoke  6 cigarettes a day and would like to quit  . Alcohol use No     Allergies     Patient has no known allergies.    Review of Systems  10 Systems reviewed and are negative for acute change except as noted in the HPI.   Physical Exam Updated Vital Signs BP 112/80 (BP Location: Right Arm)   Pulse 89   Temp 98.2 F (36.8 C) (Oral)   Resp 15   LMP 08/08/2016 (Approximate)   SpO2 100%   Physical Exam  Constitutional: She is oriented to person, place, and time. She appears well-developed and well-nourished. No distress.  Sleepy but awake  HENT:  Head: Normocephalic and atraumatic.  Moist mucous membranes  Eyes: Conjunctivae are normal. Pupils are equal, round, and reactive to light.  Neck: Neck supple.  Cardiovascular: Normal rate, regular rhythm and normal heart  sounds.   No murmur heard. Pulmonary/Chest: Effort normal and breath sounds normal.  Abdominal: Soft. Bowel sounds are normal. She exhibits no distension. There is no tenderness.  Musculoskeletal: She exhibits no edema.  Neurological: She is alert and oriented to person, place, and time.  Fluent speech  Skin: Skin is warm and dry.  Injection site at L AC, no surrounding erythema  Psychiatric:  Calm, cooperative  Nursing note and vitals reviewed.     ED Treatments / Results  Labs (all labs ordered are listed, but only abnormal results are displayed) Labs Reviewed  CBG MONITORING, ED - Abnormal; Notable for the following:       Result Value   Glucose-Capillary 100 (*)    All other components within normal limits     EKG  EKG Interpretation  Date/Time:    Ventricular Rate:    PR Interval:    QRS Duration:   QT Interval:    QTC Calculation:   R Axis:     Text Interpretation:           Radiology No results found.  Procedures Procedures (including critical care time) Procedures  Medications Ordered in ED  Medications  ondansetron (ZOFRAN-ODT) disintegrating tablet 4 mg (4 mg Oral Given 08/25/16 0858)  sodium chloride 0.9 % bolus 1,000 mL (0 mLs Intravenous Stopped 08/25/16 1200)  ondansetron (ZOFRAN) injection 4 mg (4 mg Intravenous Given 08/25/16 1106)     Initial Impression / Assessment and Plan / ED Course  I have reviewed the triage vital signs and the nursing notes.  Pertinent labs that were available during my care of the patient were reviewed by me and considered in my medical decision making (see chart for details).  Clinical Course     PT brought in by EMS after narcan, friends called when she was unresponsive after heroin use. She was awake, sleepy but neuro intact on exam w/ reassuring VS. BG 100. No complaints of pain and no skin infections. Gave IVF bolus and zofran due to vomiting. Pt later able to tolerate soda and crackers w/ no further  vomiting. Awake and comfortable after  Several hours of observation. I ordered screening labs but she refused.  Consulted the patient on importance of stopping substance abuse and offered outpatient resources. Discussed supportive care and return precautions and patient discharged in satisfactory condition.  Final Clinical Impressions(s) / ED Diagnoses   Final diagnoses:  Accidental overdose of heroin, initial encounter     Discharge Medication List as of 08/25/2016 12:35 PM         Laurence Spatesachel Morgan Saivion Goettel, MD 08/25/16 1553
